# Patient Record
Sex: Male | Born: 1980 | Race: White | Hispanic: No | Marital: Married | Smoking: Former smoker
Health system: Southern US, Community
[De-identification: ages and names within clinical notes are randomized; demographics above are authoritative.]

## PROBLEM LIST (undated history)

## (undated) DIAGNOSIS — B029 Zoster without complications: Secondary | ICD-10-CM

---
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
## 2006-03-03 HISTORY — PX: OTHER SURGICAL HISTORY: SHX169

## 2011-06-26 ENCOUNTER — Ambulatory Visit (INDEPENDENT_AMBULATORY_CARE_PROVIDER_SITE_OTHER): Payer: 59 | Admitting: Family Medicine

## 2011-06-26 ENCOUNTER — Encounter: Payer: Self-pay | Admitting: Family Medicine

## 2011-06-26 VITALS — BP 130/90 | HR 68 | Ht 70.0 in | Wt 211.0 lb

## 2011-06-26 DIAGNOSIS — M545 Low back pain: Secondary | ICD-10-CM

## 2011-06-26 DIAGNOSIS — Z9189 Other specified personal risk factors, not elsewhere classified: Secondary | ICD-10-CM

## 2011-06-26 DIAGNOSIS — M542 Cervicalgia: Secondary | ICD-10-CM

## 2011-06-26 DIAGNOSIS — M62838 Other muscle spasm: Secondary | ICD-10-CM

## 2011-06-26 DIAGNOSIS — Z202 Contact with and (suspected) exposure to infections with a predominantly sexual mode of transmission: Secondary | ICD-10-CM

## 2011-06-26 MED ORDER — CYCLOBENZAPRINE HCL 10 MG PO TABS: 5.0000 mg | ORAL_TABLET | Freq: Three times a day (TID) | ORAL | Status: AC | PRN

## 2011-06-26 MED ORDER — NAPROXEN 500 MG PO TABS: 500.0000 mg | ORAL_TABLET | Freq: Two times a day (BID) | ORAL | Status: AC

## 2011-06-26 NOTE — Patient Instructions (Signed)
Do not use any pain medications while on the naprosyn, other than Tylenol Heat, stretches at least twice daily (as shown for your neck--best done when warm).  Back Exercises Back exercises help treat and prevent back injuries. The goal of back exercises is to increase the strength of your abdominal and back muscles and the flexibility of your back. These exercises should be started when you no longer have back pain. Back exercises include:  Pelvic Tilt. Lie on your back with your knees bent. Tilt your pelvis until the lower part of your back is against the floor. Hold this position 5 to 10 sec and repeat 5 to 10 times.   Knee to Chest. Pull first 1 knee up against your chest and hold for 20 to 30 seconds, repeat this with the other knee, and then both knees. This may be done with the other leg straight or bent, whichever feels better.   Sit-Ups or Curl-Ups. Bend your knees 90 degrees. Start with tilting your pelvis, and do a partial, slow sit-up, lifting your trunk only 30 to 45 degrees off the floor. Take at least 2 to 3 seconds for each sit-up. Do not do sit-ups with your knees out straight. If partial sit-ups are difficult, simply do the above but with only tightening your abdominal muscles and holding it as directed.   Hip-Lift. Lie on your back with your knees flexed 90 degrees. Push down with your feet and shoulders as you raise your hips a couple inches off the floor; hold for 10 seconds, repeat 5 to 10 times.   Back arches. Lie on your stomach, propping yourself up on bent elbows. Slowly press on your hands, causing an arch in your low back. Repeat 3 to 5 times. Any initial stiffness and discomfort should lessen with repetition over time.   Shoulder-Lifts. Lie face down with arms beside your body. Keep hips and torso pressed to floor as you slowly lift your head and shoulders off the floor.  Do not overdo your exercises, especially in the beginning. Exercises may cause you some mild back  discomfort which lasts for a few minutes; however, if the pain is more severe, or lasts for more than 15 minutes, do not continue exercises until you see your caregiver. Improvement with exercise therapy for back problems is slow.  See your caregivers for assistance with developing a proper back exercise program. Document Released: 03/27/2004 Document Revised: 02/06/2011 Document Reviewed: 02/17/2005 [facility] Patient Information 2012 [place], [place].  Burners or Stingers, Brachial Plexus Stretch Injury Burners or stingers are injuries which commonly happen in football or any other injury that stretches or injures the nerves from the neck to your arm. The nerves which come out of your neck and supply the arm and hand on both sides are called the brachial plexus. These nerves come from the spinal cord and wind down through the neck and under the collarbone and end as the nerves in your arms. They carry messages to and from the arm and hand. They allow you to move your arm and to also have feeling in your arms and hands.  SYMPTOMS   The main problem is a burning pain that starts in the neck and shoots into the arm.   There may be numbness, weakness or brief paralysis of the arm.   There may be needles and pins feelings which run from the neck and down the arm.   The type of problem depends on which nerves are damaged.   Shoulder  weakness and muscle tenderness of the neck may occur from hours up to days after the injury or sometimes not at all.  DIAGNOSIS   Your caregiver can usually diagnose this based on the history of injury, symptoms, and a physical examination.   Electromyography and nerve conduction tests may be helpful. These are tests to tell how well your nerves are working. (Your nerves are like the wires carrying electricity in your house).   Specialized x-rays may be done to make sure there is not an injury to the cervical spine (neck) or shoulder.  HOME CARE INSTRUCTIONS   Apply  ice to the shoulder and axilla (armpit) for 15 to 20 minutes, 3 to 4 times per day while awake for the first 2 days. Put the ice in a plastic bag and place a towel between the bag of ice and your skin.   Only take over-the-counter or prescription medicines for pain, discomfort, or fever as directed by your caregiver.   If you were given a splint or sling, wear them as instructed. You may remove them to shower.   An elastic bandage wrap may be useful for shoulder or upper-arm swelling.  RETURNING TO ACTIVITIES  You will not be able to return to activities in a contact sport until full strength and range of motion of the upper extremity and neck returns to normal, and EMG studies are negative. This means the injured nerves do not show continual problems. This can take more than 6 months. There should be no residual pain.   You can maintain your cardiovascular fitness by continuing to work out your lower extremities.  PREVENTION   Prevention measures include strengthening exercises of the neck and shoulder muscles and protective devices for your neck before starting sports again if that was the cause.   More than half of these injuries will happen again. It is very important to do the strengthening exercises recommended.   Wear neck and shoulder pads that are well fitted to prevent re-injury.  Sometimes it is impossible to tell the difference between an injury to the nerves that supply the arm and the spinal cord. If there is injury to the spinal cord your symptoms may worsen. It is important to pay attention to your condition. If there is any worsening of your condition, or if your condition does not improve, contact your caregiver or go to an emergency department. SEEK IMMEDIATE MEDICAL CARE IF:   You develop severe neck pain.   You lose control of your urine or stool.   You develop weakness in your arms or legs.  MAKE SURE YOU:   Understand these instructions.   Will watch your  condition.   Will get help right away if you are not doing well or get worse.  Document Released: 05/10/2003 Document Revised: 02/06/2011 Document Reviewed: 10/06/2007 [facility] Patient Information 2012 [place], [place].

## 2011-06-26 NOTE — Progress Notes (Signed)
Chief complaint: right shoulder pain x 2 weeks and mid back pain x year and a half. Also would lke STD check  HPI:  1.5 years ago started having pain in mid back (upper lumbar/lower thoracic area, bilaterally).  Wakes up most mornings with "tense back".  No improvement after changing mattress.  Stiffness in the morning, somewhat relieved by hot shower in the morning, but has some discomfort/tightness throughout the day. Denies radiation of pain, numbness, tingling or weakness.  Tried massage once, didn't help much.  H/o SI problems in the past, s/p PT, resolved.  R shoulder pain x 2 week.  Hurts with certain head positions, once even hurt just with swallowing.  Wakes up at night due to shoulder pain.  Tried stretches, some help with hot shower. Denies radiation of pain, no numbness or tingling or weakness. Pain is near shoulder blade, and up to shoulder.  Has used Advil 800mg  once daily, but not daily, only if severe pain.  Engaged to be married.  No sex in almost 2 years, waiting until marriage to his fiance, scheduled for about a year away.  5 partners in the past.  Unprotected sex with last partner, and she is known to have had unprotected sex with other partners also.  Denies symptoms.  Would like STD testing for peace of mind, given possible risks from last sexual contact.  History reviewed. No pertinent past medical history.  Past Surgical History  Procedure Date  . 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 Marital Status: Single    Spouse Name: N/A    Number of Children: N/A  . Years of Education: N/A   Occupational History  . Youth Optician, dispensing at [employer]    Social History Main Topics  . Smoking status: Never Smoker   . Smokeless tobacco: Never Used  . Alcohol Use: Yes     1-2 drinks some weeks.  . Drug Use: No  . Sexually Active: Not on file   Other Topics Concern  . Not on file   Social History Narrative   Engaged, 1  dog    Family History  Problem Relation Age of Onset  . Fibromyalgia Mother     possibly  . Hypertension Father   . Hyperlipidemia Father   . Hypertension Brother   . Hyperlipidemia Brother   . Cancer Paternal Grandfather     lung cancer (smoker and asbestos exposure)  . Diabetes Neg Hx    No current outpatient prescriptions on file prior to visit.   No Known Allergies  ROS:  Denies fevers, URI symptoms, chest pain, shortness of breath, abdominal pain, stool changes, numbness, tingling, weakness, urinary complaints, penile discharge, lesions, other skin rashes or lesions.  PHYSICAL EXAM: BP 130/90  Pulse 68  Ht 5\' 10"  (1.778 m)  Wt 211 lb (95.709 kg)  BMI 30.28 kg/m2 Well developed, pleasant, mildly anxious-appearing male in no distress Spine--nontender.  Area of pain is paraspinous muscles at lower thoracic/upper lumbar spine.  nontender and no spasm on exam today.  No CVA tenderness Neck: limited ROM with forward flexion due to mild spasm. No knots/trigger points palpable.  Area of discomfort is paraspinous muscles, and lower trapezius/upper rhomboids. Neck: no lymphadenopathy, thyromegaly or mass Heart: regular rate and rhythm without murmur Lungs: clear bilaterally Abdomen: soft, nontender, no organomegaly or mass Extremities: no edema Skin: no rash/lesions Psych: slightly anxious, full range of affect  ASSESSMENT/PLAN:  1. Possible exposure to STD  HIV Antibody, RPR, Hepatitis B surface antigen, GC/chlamydia probe amp, urine  2. Lumbago  naproxen (NAPROSYN) 500 MG tablet  3. Neck pain on right side  naproxen (NAPROSYN) 500 MG tablet  4. Muscle spasm  cyclobenzaprine (FLEXERIL) 10 MG tablet   Neck and back pain, muscular in nature with no evidence of radiculopathy.  Trial of heat, stretches, and NSAIDs.  Shown neck stretches.  If no improvement with these measures, consider PT or chiro  STD check--low risk. HIV, RPR, HepBSag, GC and chlamydia Call Monday to cell  phone with results--okay to LM  Recommended CPE in near future

## 2011-08-28 ENCOUNTER — Telehealth: Payer: Self-pay | Admitting: *Deleted

## 2011-08-28 NOTE — Telephone Encounter (Signed)
Spoke with patient and he has been taking Aleve for just a few days w/o any relief. I offered to call in naprosyn-he stated that he okay taking the Aleve. He will give it 5-7 more days and give me a call back for appt if needed.

## 2011-08-28 NOTE — Telephone Encounter (Signed)
Patient called and stated that he still has back pain that he told you about in May. But also he has been experiencing some numbness since last visit in his left arm and left leg, not every day but today numbness has been all day today. Numbness is a dull feeling, not tingly-arm feels kind of heavy. He wanted me to send you a message back. Would you like him to schedule appt? Please advise. Thanks.

## 2011-08-28 NOTE — Telephone Encounter (Signed)
If he hasn't been taking the naprosyn recently, can refill.  Appears to be having some radicular symptoms now, that he didn't at last appointment.  If no improvement after being on NSAIDS for 7-10 days, then needs appt.  If he has already been taking them, then schedule visit.  As we discussed at visit, may benefit from PT or chiro.  Doesn't need referral for chiro, can do on his own (Dr. Thereasa Distance is recommended).  PT requires eval and referral.  If any weakness is found on exam, may need imaging, may need other med changes, which is why OV is recommended.

## 2011-10-27 ENCOUNTER — Encounter: Payer: Self-pay | Admitting: Family Medicine

## 2011-10-27 ENCOUNTER — Ambulatory Visit (INDEPENDENT_AMBULATORY_CARE_PROVIDER_SITE_OTHER): Payer: 59 | Admitting: Family Medicine

## 2011-10-27 VITALS — BP 130/76 | HR 80 | Ht 70.0 in | Wt 208.0 lb

## 2011-10-27 DIAGNOSIS — E7800 Pure hypercholesterolemia, unspecified: Secondary | ICD-10-CM

## 2011-10-27 DIAGNOSIS — M545 Low back pain, unspecified: Secondary | ICD-10-CM

## 2011-10-27 DIAGNOSIS — Z0000 Encounter for general adult medical examination without abnormal findings: Secondary | ICD-10-CM

## 2011-10-27 DIAGNOSIS — M546 Pain in thoracic spine: Secondary | ICD-10-CM

## 2011-10-27 LAB — POCT URINALYSIS DIPSTICK
Bilirubin, UA: NEGATIVE
Glucose, UA: NEGATIVE
Ketones, UA: NEGATIVE
Leukocytes, UA: NEGATIVE

## 2011-10-27 NOTE — Progress Notes (Signed)
Chief Complaint  Patient presents with  . Annual Exam    non fasting CPE. UA showed trace protein. Still having back pain that was mentioned @ April 2013. Unsure if his insurance covers chiropractor, but does probably cover ortho.   Jason Casey is a 31 y.o. male who presents for a complete physical.  He has the following concerns:  Some ongoing pain in upper back and some lower back.  Naprosyn helped most of the time.  Ran out, and has been using ibuprofen just prn.  Heat and stretches help some.  Has pain in his upper back, near shoulder blades during the night, better after stretching. Sometimes affects his sleep.  Health Maintenance: There is no immunization history on file for this patient. Thinks he may have had tetanus in 4/08 after slicing off part of his finger.  Not sure though. Doesn't take flu shots Last colonoscopy: never Ophtho: >5 years Dentist: >1 year ago Exercise: 5-6 days/week over the last 1-2 months, both cardio and weights Recalls having cholesterol "a little high" in the past.  History reviewed. No pertinent past medical history.  Past Surgical History  Procedure Date  . 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 Marital Status: Single    Spouse Name: N/A    Number of Children: N/A  . Years of Education: N/A   Occupational History  . Youth Optician, dispensing at [employer]    Social History Main Topics  . Smoking status: Never Smoker   . Smokeless tobacco: Never Used  . Alcohol Use: Yes     1-2 drinks some weeks.  . Drug Use: No  . Sexually Active: Not Currently   Other Topics Concern  . Not on file   Social History Narrative   Engaged, 1 dog.  Getting married July 2014    Family History  Problem Relation Age of Onset  . Fibromyalgia Mother     possibly  . Diabetes Mother     recently diagnosed, checking sugars, no meds  . Hypertension Father   . Hyperlipidemia Father   . Hypertension Brother   .  Hyperlipidemia Brother   . Cancer Paternal Grandfather     lung cancer (smoker and asbestos exposure)   Meds: ibuprofen as needed (OTC)  No Known Allergies  ROS:  The patient denies anorexia, fever, weight changes,  vision loss, decreased hearing, ear pain, hoarseness, chest pain, palpitations, dizziness, syncope, dyspnea on exertion, cough, swelling, nausea, vomiting, diarrhea, constipation, abdominal pain, melena, hematochezia, indigestion/heartburn, hematuria, incontinence, erectile dysfunction, nocturia, weakened urine stream, dysuria, genital lesions, joint pains, numbness, tingling, weakness, tremor, suspicious skin lesions, depression, anxiety, abnormal bleeding/bruising, or enlarged lymph nodes Some shortness of breath when having pain between shoulder blades (short term, better with stretching).  Pain not related to eating, usually in mornings only. Some lightheadedness when he was using Aleve (after finishing naproxen).  No problems since using ibuprofen instead.  Occasional headaches, especially if not well hydrated. Some dry spots within mustache, and occasional itchy dry spot on L cheek.  Skin tag on scrotum  PHYSICAL EXAM: BP 140/80  Pulse 80  Ht 5\' 10"  (1.778 m)  Wt 208 lb (94.348 kg)  BMI 29.84 kg/m2 130/76 General Appearance:    Alert, cooperative, no distress, appears stated age  Head:    Normocephalic, without obvious abnormality, atraumatic  Eyes:    PERRL, conjunctiva/corneas clear, EOM's intact, fundi    benign  Ears:    Normal TM's and external ear canals  Nose:   Nares normal, mucosa normal, no drainage or sinus   tenderness  Throat:   Lips, mucosa, and tongue normal; teeth and gums normal  Neck:   Supple, no lymphadenopathy;  thyroid:  no   enlargement/tenderness/nodules; no carotid   bruit or JVD  Back:    Spine nontender, no curvature, ROM normal, no CVA     Tenderness.  Area of pain is in upper rhomboids.  No spasm noted today, mildly tender  Lungs:     Clear  to auscultation bilaterally without wheezes, rales or     ronchi; respirations unlabored  Chest Wall:    No tenderness or deformity   Heart:    Regular rate and rhythm, S1 and S2 normal, no murmur, rub   or gallop  Breast Exam:    No chest wall tenderness, masses or gynecomastia  Abdomen:     Soft, non-tender, nondistended, normoactive bowel sounds,    no masses, no hepatosplenomegaly  Genitalia:    Normal male external genitalia without lesions.  Testicles without masses.  No inguinal hernias.  Rectal:   Deferred due to age <40 and lack of symptoms  Extremities:   No clubbing, cyanosis or edema  Pulses:   2+ and symmetric all extremities  Skin:   Skin color, texture, turgor normal, no lesions. My erythema and flaking noted under R side of mustache hair.   Lymph nodes:   Cervical, supraclavicular, and axillary nodes normal  Neurologic:   CNII-XII intact, normal strength, sensation and gait; reflexes 2+ and symmetric throughout          Psych:   Normal mood, affect, hygiene and grooming.    ASSESSMENT/PLAN: 1. Routine general medical examination at a health care facility  POCT Urinalysis Dipstick, Visual acuity screening  2. Lumbago  Ambulatory referral to Physical Therapy  3. Thoracic back pain  Ambulatory referral to Physical Therapy    Labs recommended:  Lipids (high in past), glucose.  He prefers to do in 3 months, allowing for further work on diet for now.  Check with [facility] re: tetanus. If he did not receive Tdap, then schedule NV  Refer to PT for back (upper and lower) pain  Probable seborrheic dermatitis by history--discussed OTC treatments (dandruff shampoo vs OTC cortisone creams)

## 2011-10-27 NOTE — Patient Instructions (Addendum)
HEALTH MAINTENANCE RECOMMENDATIONS:  It is recommended that you get at least 30 minutes of aerobic exercise at least 5 days/week (for weight loss, you may need as much as 60-90 minutes). This can be any activity that gets your heart rate up. This can be divided in 10-15 minute intervals if needed, but try and build up your endurance at least once a week.  Weight bearing exercise is also recommended twice weekly.  Eat a healthy diet with lots of vegetables, fruits and fiber.  "Colorful" foods have a lot of vitamins (ie green vegetables, tomatoes, red peppers, etc).  Limit sweet tea, regular sodas and alcoholic beverages, all of which has a lot of calories and sugar.  Up to 2 alcoholic drinks daily may be beneficial for men (unless trying to lose weight, watch sugars).  Drink a lot of water.  Sunscreen of at least SPF 30 should be used on all sun-exposed parts of the skin when outside between the hours of 10 am and 4 pm (not just when at beach or pool, but even with exercise, golf, tennis, and yard work!)  Use a sunscreen that says "broad spectrum" so it covers both UVA and UVB rays, and make sure to reapply every 1-2 hours.  Remember to change the batteries in your smoke detectors when changing your clock times in the spring and fall.  Use your seat belt every time you are in a car, and please drive safely and not be distracted with cell phones and texting while driving.  Check on your last tetanus shot--I would like to know the date and the TYPE (ie plain tetanus vs TdaP).  A TdaP is recommended (contains pertussis vaccine also).  Check your blood pressure periodically.  Goal is <130-135/80-85.  If consistently >140/90 then medications should be considered.  Daily exercise, weight loss and low sodium diet helps lower blood pressure.  Schedule visits with eye doctor (routine check) and dental cleanings.  2 Gram Low Sodium Diet A 2 gram sodium diet restricts the amount of sodium in the diet to no  more than 2 g or 2000 mg daily. Limiting the amount of sodium is often used to help lower blood pressure. It is important if you have heart, liver, or kidney problems. Many foods contain sodium for flavor and sometimes as a preservative. When the amount of sodium in a diet needs to be low, it is important to know what to look for when choosing foods and drinks. The following includes some information and guidelines to help make it easier for you to adapt to a low sodium diet. QUICK TIPS  Do not add salt to food.   Avoid convenience items and fast food.   Choose unsalted snack foods.   Buy lower sodium products, often labeled as "lower sodium" or "no salt added."   Check food labels to learn how much sodium is in 1 serving.   When eating at a restaurant, ask that your food be prepared with less salt or none, if possible.  READING FOOD LABELS FOR SODIUM INFORMATION The nutrition facts label is a good place to find how much sodium is in foods. Look for products with no more than 500 to 600 mg of sodium per meal and no more than 150 mg per serving. Remember that 2 g = 2000 mg. The food label may also list foods as:  Sodium-free: Less than 5 mg in a serving.   Very low sodium: 35 mg or less in a serving.  Low-sodium: 140 mg or less in a serving.   Light in sodium: 50% less sodium in a serving. For example, if a food that usually has 300 mg of sodium is changed to become light in sodium, it will have 150 mg of sodium.   Reduced sodium: 25% less sodium in a serving. For example, if a food that usually has 400 mg of sodium is changed to reduced sodium, it will have 300 mg of sodium.  CHOOSING FOODS Grains  Avoid: Salted crackers and snack items. Some cereals, including instant hot cereals. Bread stuffing and biscuit mixes. Seasoned rice or pasta mixes.   Choose: Unsalted snack items. Low-sodium cereals, oats, puffed wheat and rice, shredded wheat. English muffins and bread. Pasta.   Meats  Avoid: Salted, canned, smoked, spiced, pickled meats, including fish and poultry. Bacon, ham, sausage, cold cuts, hot dogs, anchovies.   Choose: Low-sodium canned tuna and salmon. Fresh or frozen meat, poultry, and fish.  Dairy  Avoid: Processed cheese and spreads. Cottage cheese. Buttermilk and condensed milk. Regular cheese.   Choose: Milk. Low-sodium cottage cheese. Yogurt. Sour cream. Low-sodium cheese.  Fruits and Vegetables  Avoid: Regular canned vegetables. Regular canned tomato sauce and paste. Frozen vegetables in sauces. Olives. Rosita Fire. Relishes. Sauerkraut.   Choose: Low-sodium canned vegetables. Low-sodium tomato sauce and paste. Frozen or fresh vegetables. Fresh and frozen fruit.  Condiments  Avoid: Canned and packaged gravies. Worcestershire sauce. Tartar sauce. Barbecue sauce. Soy sauce. Steak sauce. Ketchup. Onion, garlic, and table salt. Meat flavorings and tenderizers.   Choose: Fresh and dried herbs and spices. Low-sodium varieties of mustard and ketchup. Lemon juice. Tabasco sauce. Horseradish.  SAMPLE 2 GRAM SODIUM MEAL PLAN Breakfast / Sodium (mg)  1 cup low-fat milk / 143 mg   2 slices whole-wheat toast / 270 mg   1 tbs heart-healthy margarine / 153 mg   1 hard-boiled egg / 139 mg   1 small orange / 0 mg  Lunch / Sodium (mg)  1 cup raw carrots / 76 mg    cup hummus / 298 mg   1 cup low-fat milk / 143 mg    cup red grapes / 2 mg   1 whole-wheat pita bread / 356 mg  Dinner / Sodium (mg)  1 cup whole-wheat pasta / 2 mg   1 cup low-sodium tomato sauce / 73 mg   3 oz lean ground beef / 57 mg   1 small side salad (1 cup raw spinach leaves,  cup cucumber,  cup yellow bell pepper) with 1 tsp olive oil and 1 tsp red wine vinegar / 25 mg  Snack / Sodium (mg)  1 container low-fat vanilla yogurt / 107 mg   3 graham cracker squares / 127 mg  Nutrient Analysis  Calories: 2033   Protein: 77 g   Carbohydrate: 282 g   Fat: 72 g    Sodium: 1971 mg  Document Released: 02/17/2005 Document Revised: 02/06/2011 Document Reviewed: 05/21/2009 [facility] Patient Information 2012 [place], [place].  Seborrheic Dermatitis Seborrheic dermatitis involves pink or red skin with greasy, flaky scales. This is often found on the scalp, eyebrows, nose, bearded area, and on or behind the ears. It can also occur on the central chest. It often occurs where there are more oil (sebaceous) glands. This condition is also known as dandruff. When this condition affects a baby's scalp, it is called cradle cap. It may come and go for no known reason. It can occur at any time of life  from infancy to old age. CAUSES  The cause is unknown. It is not the result of too little moisture or too much oil. In some people, seborrheic dermatitis flare-ups seem to be triggered by stress. It also commonly occurs in people with certain diseases such as Parkinson's disease or HIV/AIDS. SYMPTOMS   Thick scales on the scalp.   Redness on the face or in the armpits.   The skin may seem oily or dry, but moisturizers do not help.   In infants, seborrheic dermatitis appears as scaly redness that does not seem to bother the baby. In some babies, it affects only the scalp. In others, it also affects the neck creases, armpits, groin, or behind the ears.   In adults and adolescents, seborrheic dermatitis may affect only the scalp. It may look patchy or spread out, with areas of redness and flaking. Other areas commonly affected include:   Eyebrows.   Eyelids.   Forehead.   Skin behind the ears.   Outer ears.   Chest.   Armpits.   Nose creases.   Skin creases under the breasts.   Skin between the buttocks.   Groin.   Some adults and adolescents feel itching or burning in the affected areas.  DIAGNOSIS  Your caregiver can usually tell what the problem is by doing a physical exam. TREATMENT   Cortisone (steroid) ointments, creams, and lotions can help  decrease inflammation.   Babies can be treated with baby oil to soften the scales, then they may be washed with baby shampoo. If this does not help, medicated shampoos should work.   Adults can also use medicated shampoos.   Your caregiver may prescribe corticosteroid cream and shampoo containing an antifungal or yeast medicine (ketoconazole). Hydrocortisone or anti-yeast cream can be rubbed directly onto seborrheic dermatitis patches. Yeast does not cause seborrheic dermatitis, but it seems to add to the problem.  In infants, seborrheic dermatitis is often worst during the first year of life. It tends to disappear on its own as the child grows. However, it may return during the teenage years. In adults and adolescents, seborrheic dermatitis tends to be a long-lasting condition that comes and goes over many years. HOME CARE INSTRUCTIONS   Use prescribed medicines as directed.   In infants, do not aggressively remove the scales or flakes on the scalp with a comb or by other means. This may lead to hair loss.  SEEK MEDICAL CARE IF:   The problem does not improve from the medicated shampoos, lotions, or other medicines given by your caregiver.   You have any other questions or concerns.  Document Released: 02/17/2005 Document Revised: 02/06/2011 Document Reviewed: 07/09/2009 [facility] Patient Information 2012 [place], [place].

## 2011-11-17 ENCOUNTER — Ambulatory Visit: Payer: 59 | Attending: Family Medicine | Admitting: Physical Therapy

## 2011-11-17 DIAGNOSIS — M546 Pain in thoracic spine: Secondary | ICD-10-CM | POA: Insufficient documentation

## 2011-11-17 DIAGNOSIS — R293 Abnormal posture: Secondary | ICD-10-CM | POA: Insufficient documentation

## 2011-11-17 DIAGNOSIS — M545 Low back pain, unspecified: Secondary | ICD-10-CM | POA: Insufficient documentation

## 2011-11-17 DIAGNOSIS — IMO0001 Reserved for inherently not codable concepts without codable children: Secondary | ICD-10-CM | POA: Insufficient documentation

## 2011-11-26 ENCOUNTER — Ambulatory Visit: Payer: 59 | Admitting: Rehabilitation

## 2011-11-28 ENCOUNTER — Ambulatory Visit: Payer: 59 | Admitting: Physical Therapy

## 2011-12-01 ENCOUNTER — Ambulatory Visit: Payer: 59 | Admitting: Rehabilitation

## 2011-12-03 ENCOUNTER — Ambulatory Visit: Payer: 59 | Attending: Family Medicine | Admitting: Rehabilitation

## 2011-12-03 DIAGNOSIS — M545 Low back pain, unspecified: Secondary | ICD-10-CM | POA: Insufficient documentation

## 2011-12-03 DIAGNOSIS — IMO0001 Reserved for inherently not codable concepts without codable children: Secondary | ICD-10-CM | POA: Insufficient documentation

## 2011-12-03 DIAGNOSIS — M546 Pain in thoracic spine: Secondary | ICD-10-CM | POA: Insufficient documentation

## 2011-12-03 DIAGNOSIS — R293 Abnormal posture: Secondary | ICD-10-CM | POA: Insufficient documentation

## 2011-12-08 ENCOUNTER — Ambulatory Visit: Payer: 59 | Admitting: Rehabilitation

## 2011-12-10 ENCOUNTER — Ambulatory Visit: Payer: 59 | Admitting: Rehabilitation

## 2011-12-15 ENCOUNTER — Ambulatory Visit: Payer: 59 | Admitting: Rehabilitation

## 2011-12-17 ENCOUNTER — Encounter: Payer: 59 | Admitting: Rehabilitation

## 2012-10-05 ENCOUNTER — Other Ambulatory Visit: Payer: Self-pay

## 2012-11-17 ENCOUNTER — Telehealth: Payer: Self-pay | Admitting: *Deleted

## 2012-11-17 NOTE — Telephone Encounter (Signed)
Left message for pt to return my call when he could.

## 2012-11-17 NOTE — Telephone Encounter (Signed)
Message copied by Melonie Florida on Wed Nov 17, 2012  1:18 PM ------      Message from: KNAPP, EVE      Created: Mon Nov 15, 2012  9:02 AM       Send letter or call--seen over a year ago, never came for fasting labs.  I know he took a new job, not sure if he is still in the area ------

## 2013-02-22 ENCOUNTER — Encounter: Payer: Self-pay | Admitting: Family Medicine

## 2013-02-22 ENCOUNTER — Ambulatory Visit (INDEPENDENT_AMBULATORY_CARE_PROVIDER_SITE_OTHER): Payer: BC Managed Care – PPO | Admitting: Family Medicine

## 2013-02-22 VITALS — BP 124/80 | HR 97 | Temp 99.7°F | Wt 198.0 lb

## 2013-02-22 DIAGNOSIS — J069 Acute upper respiratory infection, unspecified: Secondary | ICD-10-CM

## 2013-02-22 NOTE — Patient Instructions (Addendum)
You can take 4 ibuprofen 3 times per day. NyQuil at night. This usually runs its course in about 7-10 days. Use Afrin nasal spray but only at nightUpper Respiratory Infection, Adult An upper respiratory infection (URI) is also known as the common cold. It is often caused by a type of germ (virus). Colds are easily spread (contagious). You can pass it to others by kissing, coughing, sneezing, or drinking out of the same glass. Usually, you get better in 1 or 2 weeks.  HOME CARE   Only take medicine as told by your doctor.  Use a warm mist humidifier or breathe in steam from a hot shower.  Drink enough water and fluids to keep your pee (urine) clear or pale yellow.  Get plenty of rest.  Return to work when your temperature is back to normal or as told by your doctor. You may use a face mask and wash your hands to stop your cold from spreading. GET HELP RIGHT AWAY IF:   After the first few days, you feel you are getting worse.  You have questions about your medicine.  You have chills, shortness of breath, or brown or red spit (mucus).  You have yellow or brown snot (nasal discharge) or pain in the face, especially when you bend forward.  You have a fever, puffy (swollen) neck, pain when you swallow, or white spots in the back of your throat.  You have a bad headache, ear pain, sinus pain, or chest pain.  You have a high-pitched whistling sound when you breathe in and out (wheezing).  You have a lasting cough or cough up blood.  You have sore muscles or a stiff neck. MAKE SURE YOU:   Understand these instructions.  Will watch your condition.  Will get help right away if you are not doing well or get worse. Document Released: 08/06/2007 Document Revised: 05/12/2011 Document Reviewed: 06/24/2010 [facility] Patient Information 2014 [place], [place].

## 2013-02-22 NOTE — Progress Notes (Signed)
   Subjective:    Patient ID: Jason Casey, male    DOB: October 09, 1980, 32 y.o.   MRN: [id number]  HPI 3 days ago he started having difficulty with a slight cough followed by congestion, fever or headache malaise and myalgias and fatigue. The cough has become slightly productive. He does have a trip planned to [place] after Christmas. He does not smoke.   Review of Systems     Objective:   Physical Exam alert and in no distress. Tympanic membranes and canals are normal. Throat is clear. Tonsils are normal. Neck is supple without adenopathy or thyromegaly. Cardiac exam shows a regular sinus rhythm without murmurs or gallops. Lungs are clear to auscultation.        Assessment & Plan:  Acute URI  recommend supportive care with NyQuil, plenty of fluids and NSAID of choice for aches and pains. Call if continued difficulty.

## 2013-08-17 ENCOUNTER — Ambulatory Visit (INDEPENDENT_AMBULATORY_CARE_PROVIDER_SITE_OTHER): Payer: BC Managed Care – PPO | Admitting: Family Medicine

## 2013-08-17 ENCOUNTER — Encounter: Payer: Self-pay | Admitting: Family Medicine

## 2013-08-17 VITALS — BP 138/68 | HR 80 | Temp 98.1°F | Ht 70.0 in | Wt 206.0 lb

## 2013-08-17 DIAGNOSIS — IMO0001 Reserved for inherently not codable concepts without codable children: Secondary | ICD-10-CM

## 2013-08-17 DIAGNOSIS — W57XXXA Bitten or stung by nonvenomous insect and other nonvenomous arthropods, initial encounter: Principal | ICD-10-CM

## 2013-08-17 DIAGNOSIS — S40861A Insect bite (nonvenomous) of right upper arm, initial encounter: Secondary | ICD-10-CM

## 2013-08-17 MED ORDER — CEPHALEXIN 500 MG PO CAPS: 500.0000 mg | ORAL_CAPSULE | Freq: Three times a day (TID) | ORAL | Status: DC

## 2013-08-17 NOTE — Progress Notes (Signed)
Chief Complaint  Patient presents with  . Insect Bite    Saturday night area on his right bicep was itching and he found a little place that he popped. Now area is larger and tender, slight itching. Did not remove any kind of insect. Right arm was tingling this morning.    While in [place] this past weekend, felt it itching on his right upper arm, there was a small pustule that he popped, small amount of drainage.  He didn't notice any redness until last night.  It is slightly itchy.  Denies fevers, myalgias, headaches, bleeding/bruising, rashes elsewhere on the body. There was no tick that he ever saw attached--was not aware of any known insect bite (didn't see spider, bee, etc).  He is going to [place] tomorrow.  History reviewed. No pertinent past medical history. Past Surgical History  Procedure Laterality Date  . 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 Marital Status: Single    Spouse Name: N/A    Number of Children: N/A  . Years of Education: N/A   Occupational History  . Youth Optician, dispensingMinister at [employer]    Social History Main Topics  . Smoking status: Never Smoker   . Smokeless tobacco: Never Used  . Alcohol Use: Yes     Comment: 1-2 drinks some weeks.  . Drug Use: No  . Sexual Activity: Not Currently   Other Topics Concern  . Not on file   Social History Narrative   Married, 1 dog, 1 cat.  Social studies teacher at [employer]   Family History  Problem Relation Age of Onset  . Fibromyalgia Mother     possibly  . Diabetes Mother     recently diagnosed, checking sugars, no meds  . Hypertension Father   . Hyperlipidemia Father   . Hypertension Brother   . Hyperlipidemia Brother   . Cancer Paternal Grandfather     lung cancer (smoker and asbestos exposure)   No current outpatient prescriptions on file prior to visit.   No current facility-administered medications on file prior to visit.   No Known  Allergies  ROS:  No fevers, chills, nausea, vomiting, headache, dizziness, chest pain, URI symptoms, bleeding/bruising, myalgias or other complaints--see HPI  PHYSICAL EXAM: BP 138/68  Pulse 80  Temp(Src) 98.1 F (36.7 C) (Oral)  Ht 5\' 10"  (1.778 m)  Wt 206 lb (93.441 kg)  BMI 29.56 kg/m2  Well developed, pleasant male in no distress Right anterior upper bicep area --there is a circular raised area measuring 47x45 mm, with the 15-17 mm of the most central area being a darker red, and the rest being very light.  The center has a very tiny raised scab.  No pustule. No fluctuance, no induration.  No streaks  ASSESSMENT/PLAN:  Insect bite of arm, right  Insect bite with local reaction vs early cellulitis. Treat with ice and antihistamines (ie zyrtec OR claritin, benadryl at bedtime if needed for itching) The area of swelling was marked with pen.  If the redness and swelling extend beyond the markings in the next 24-48 hours despite the ice and antihistamines (especially if fever develops), then start the antibiotics, and take the full course.  Please return and/or call if you develop a target lesion (rash looking like a bulls-eye), rash, fevers, body aches/joint pains, severe headache, or other symptoms.

## 2013-08-17 NOTE — Patient Instructions (Signed)
  Insect bite with local reaction vs early cellulitis. Treat with ice and antihistamines (ie zyrtec OR claritin, benadryl at bedtime if needed for itching) The area of swelling was marked with pen.  If the redness and swelling extend beyond the markings in the next 24-48 hours despite the ice and antihistamines (especially if fever develops), then start the antibiotics, and take the full course.  Please return and/or call if you develop a target lesion (rash looking like a bulls-eye), rash, fevers, body aches/joint pains, severe headache, or other symptoms.

## 2016-04-04 ENCOUNTER — Ambulatory Visit (INDEPENDENT_AMBULATORY_CARE_PROVIDER_SITE_OTHER): Payer: BC Managed Care – PPO | Admitting: Family Medicine

## 2016-04-04 ENCOUNTER — Encounter: Payer: Self-pay | Admitting: Family Medicine

## 2016-04-04 VITALS — BP 120/70 | HR 74 | Temp 98.4°F | Resp 16 | Wt 208.6 lb

## 2016-04-04 DIAGNOSIS — R05 Cough: Secondary | ICD-10-CM | POA: Diagnosis not present

## 2016-04-04 DIAGNOSIS — R059 Cough, unspecified: Secondary | ICD-10-CM

## 2016-04-04 DIAGNOSIS — J069 Acute upper respiratory infection, unspecified: Secondary | ICD-10-CM

## 2016-04-04 MED ORDER — AZITHROMYCIN 250 MG PO TABS: ORAL_TABLET | ORAL | 0 refills | Status: DC

## 2016-04-04 NOTE — Progress Notes (Signed)
Subjective: Chief Complaint  Patient presents with  . cough    coughing, phelgm- yellow, headache, tired     Jason NettersDavid Casey is a 36 y.o. male who presents for a week history of cough that is mainly dry and productive in the morning. States he feels like his cough is getting worse.   Denies fever, chills, shortness of breath, wheezing, GI or GU symptoms.  Denies history of pneumonia or bronchitis. No recent antibiotics.   Treatment to date: cough suppressants and decongestants.  Denies sick contacts.  No other aggravating or relieving factors.  No other c/o.  ROS as in subjective.   Objective: Vitals:   04/04/16 1517  BP: 120/70  Pulse: 74  Resp: 16  Temp: 98.4 F (36.9 C)    General appearance: Alert, WD/WN, no distress, mildly ill appearing                             Skin: warm, no rash                           Head: no sinus tenderness                            Eyes: conjunctiva normal, corneas clear, PERRLA                            Ears: pearly TMs, external ear canals normal                          Nose: septum midline, turbinates swollen, with erythema and clear discharge             Mouth/throat: MMM, tongue normal, mild pharyngeal erythema                           Neck: supple, no adenopathy, no thyromegaly, nontender                          Heart: RRR, normal S1, S2, no murmurs                         Lungs: CTA bilaterally, no wheezes, rales, or rhonchi      Assessment: Cough  Acute URI    Plan: Discussed diagnosis and treatment of URI and cough. Z-pack sent to pharmacy.  Suggested symptomatic OTC remedies. Nasal saline spray for congestion.  Tylenol or Ibuprofen OTC for fever and malaise.  Call/return if not back to baseline on day 10 of starting the antibiotic or sooner if he gets worse.

## 2017-09-21 ENCOUNTER — Ambulatory Visit: Payer: BC Managed Care – PPO | Admitting: Family Medicine

## 2017-09-21 ENCOUNTER — Encounter: Payer: Self-pay | Admitting: Family Medicine

## 2017-09-21 VITALS — BP 120/74 | HR 80 | Ht 69.0 in | Wt 202.0 lb

## 2017-09-21 DIAGNOSIS — Z23 Encounter for immunization: Secondary | ICD-10-CM

## 2017-09-21 DIAGNOSIS — Z111 Encounter for screening for respiratory tuberculosis: Secondary | ICD-10-CM

## 2017-09-21 DIAGNOSIS — Z0289 Encounter for other administrative examinations: Secondary | ICD-10-CM | POA: Diagnosis not present

## 2017-09-21 NOTE — Progress Notes (Signed)
Chief Complaint  Patient presents with  . Immunizations    need updated immunzations and ppd. Has forms that need to be filled out.    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 He brings in some immunizations, and also printed from [place].  He recalls his last tetanus was in 1993 related to an injury. He only has had 1 MMR documented. He does not have any known exposure to TB, no cough, hemoptysis, night sweats.  He is required to get PPD for school  Immunization History  Administered Date(s) Administered  . DTaP 11/12/1980, 02/14/1981, 04/30/1981, 06/20/1982  . IPV 11/12/1980, 02/14/1981, 04/30/1981, 06/20/1982  . MMR 01/04/1982, 09/21/2017  . PPD Test 09/21/2017  . Tdap 09/21/2017   PMH, PSH, SH reviewed  No current outpatient medications on file prior to visit.   No current facility-administered medications on file prior to visit.    No Known Allergies  ROS:  No fever, chills, headaches, dizziness, chest pain, shortness of breath, hearing or vision problems, back pain, GI complaints, rashes or other concerns.  PHYSICAL EXAM: BP 120/74   Pulse 80   Ht _0  (1.753 m)   Wt 202 lb (91.6 kg)   BMI 29.83 kg/m   Well appearing, pleasant male in no distress HEENT: conjunctiva and sclera are clear, EOMI, PERRL OP clear Neck: no lymphadenopathy, thyromegaly or mass Heart: regular rate and rhythm Lungs: clear bilaterally Back: no spinal or CVA tenderness Abdomen: soft, nontender, no mass Chest wall nontender, no mass Extremities: no edema Skin: normal turgor, no rash Psych: normal mood, affect, hygiene and grooming Neuro: alert and oriented, normal cranial nerves and gait  ASSESSMENT/PLAN:  Encounter for completion of form with patient  Need for Tdap vaccination - Plan: Tdap vaccine greater than or equal to 7yo IM  Need for MMR vaccine - Plan: MMR vaccine subcutaneous  Screening examination for pulmonary  tuberculosis - Plan: PPD   Tdap MMR #2 PPD placed To return in 2-3 days for PPD reading. Risks/side effects of vaccines reviewed   To consider Hep A vaccination (not required, no rush) Discussed other "recommended" but not required vaccines listed on [employer]'s forms--not at high risk for meningitis (not living in dorm, small classes), in monogamous relationship, not high risk for HPV. Not at risk for Hep B with his job. Continue yearly flu shots in the fall Pt to schedule a physical.  Forms filled out (pending PPD reading), no restrictions.

## 2017-12-03 ENCOUNTER — Encounter: Payer: Self-pay | Admitting: *Deleted

## 2018-01-31 DIAGNOSIS — B029 Zoster without complications: Secondary | ICD-10-CM

## 2018-01-31 HISTORY — DX: Zoster without complications: B02.9

## 2018-02-13 ENCOUNTER — Telehealth: Payer: Self-pay | Admitting: Nurse Practitioner

## 2018-02-13 DIAGNOSIS — B029 Zoster without complications: Secondary | ICD-10-CM

## 2018-02-13 MED ORDER — VALACYCLOVIR HCL 1 G PO TABS: 1000.0000 mg | ORAL_TABLET | Freq: Three times a day (TID) | ORAL | 0 refills | Status: DC

## 2018-02-13 MED ORDER — GABAPENTIN 300 MG PO CAPS: 300.0000 mg | ORAL_CAPSULE | Freq: Two times a day (BID) | ORAL | 0 refills | Status: DC | PRN

## 2018-02-13 NOTE — Addendum Note (Signed)
Addended by: Bennie PieriniMARTIN, MARY-MARGARET on: 02/13/2018 03:45 PM   Modules accepted: Orders

## 2018-02-13 NOTE — Progress Notes (Signed)
E-visit for Shingles   We are sorry that you are not feeling well. Here is how we plan to help!  Based on what you shared with me it looks like you have shingles.  Shingles or herpes zoster, is a common infection of the nerves.  It is a painful rash caused by the herpes zoster virus.  This is the same virus that causes chickenpox.  After a person has chickenpox, the virus remains inactive in the nerve cells.  Years later, the virus can become active again and travel to the skin.  It typically will appear on one side of the face or body.  Burning or shooting pain, tingling, or itching are early signs of the infection.  Blisters typically scab over in 7 to 10 days and clear up within 2-4 weeks. Shingles is only contagious to people that have never had the chickenpox, the chickenpox vaccine, or anyone who has a compromised immune system.  You should avoid contact with these type of people until your blisters scab over.  I have prescribed Valacyclovir 1g three times daily for 7 days and also Gabapentin 300mg twice daily as needed for pain   HOME CARE: . Apply ice packs (wrapped in a thin towel), cool compresses, or soak in cool bath to help reduce pain. . Use calamine lotion to calm itchy skin. . Avoid scratching the rash. . Avoid direct sunlight.  GET HELP RIGHT AWAY IF: . Symptoms that don't away after treatment. . A rash or blisters near your eye. . Increased drainage, fever, or rash after treatment. . Severe pain that doesn't go away.   MAKE SURE YOU    Understand these instructions.  Will watch your condition.  Will get help right away if you are not doing well or get worse.  Thank you for choosing an e-visit. Your e-visit answers were reviewed by a board certified advanced clinical practitioner to complete your personal care plan. Depending upon the condition, your plan could have included both over the counter or prescription medications.  Please review your pharmacy choice.  Make sure the pharmacy is open so you can pick up prescription now. If there is a problem, you may contact your provider through MyChart messaging and have the prescription routed to another pharmacy.  Your safety is important to us. If you have drug allergies check your prescription carefully.   For the next 24 hours you can use MyChart to ask questions about today's visit, request a non-urgent call back, or ask for a work or school excuse.  You will get an email in the next two days asking about your experience. I hope that your e-visit has been valuable and will speed your recovery  

## 2018-07-20 ENCOUNTER — Telehealth: Payer: Self-pay | Admitting: Physician Assistant

## 2018-07-20 ENCOUNTER — Encounter (HOSPITAL_COMMUNITY): Payer: Self-pay | Admitting: Family Medicine

## 2018-07-20 ENCOUNTER — Other Ambulatory Visit: Payer: Self-pay

## 2018-07-20 ENCOUNTER — Ambulatory Visit (HOSPITAL_COMMUNITY)
Admission: EM | Admit: 2018-07-20 | Discharge: 2018-07-20 | Disposition: A | Discharge status: Home / Self Care | Payer: BC Managed Care – PPO | Attending: Family Medicine | Admitting: Family Medicine

## 2018-07-20 DIAGNOSIS — S76311A Strain of muscle, fascia and tendon of the posterior muscle group at thigh level, right thigh, initial encounter: Secondary | ICD-10-CM

## 2018-07-20 DIAGNOSIS — R21 Rash and other nonspecific skin eruption: Secondary | ICD-10-CM

## 2018-07-20 DIAGNOSIS — L290 Pruritus ani: Secondary | ICD-10-CM

## 2018-07-20 MED ORDER — KETOCONAZOLE 2 % EX CREA: 1.0000 "application " | TOPICAL_CREAM | Freq: Two times a day (BID) | CUTANEOUS | 0 refills | Status: DC

## 2018-07-20 NOTE — ED Provider Notes (Signed)
[facility]    CSN: [id number] Arrival date & time: 07/20/18  1558     History   Chief Complaint Chief Complaint  Patient presents with  . Groin Pain    HPI Jason Casey is a 38 y.o. male.   This is a 38 year old man who comes in with abdominal cramping, frequent urination, and rectal itching.  This is his initial visit to [facility] urgent care.  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  Patient developed a hamstring strain in the inner groin area about 1 month ago and is gradually getting better.  He still has some soreness in the medial groin area of the perineum.  Patient also has rectal itching and mild discomfort in the right perineal crease.  He also has some redness in the skin of his posterior scrotum.     History reviewed. No pertinent past medical history.  Patient Active Problem List   Diagnosis Date Noted  . Lumbago 10/27/2011    Past Surgical History:  Procedure Laterality Date  . 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 Authorizing Provider  gabapentin (NEURONTIN) 300 MG capsule Take 1 capsule (300 mg total) by mouth 2 (two) times daily as needed. 02/13/18   Daphine DeutscherMartin, Mary-Margaret, FNP  ketoconazole (NIZORAL) 2 % cream Apply 1 application topically 2 (two) times daily. 07/20/18   Elvina SidleLauenstein, Chistian Kasler, MD  valACYclovir (VALTREX) 1000 MG tablet Take 1 tablet (1,000 mg total) by mouth 3 (three) times daily. 02/13/18   Bennie PieriniMartin, Mary-Margaret, FNP    Family History Family History  Problem Relation Age of Onset  . Fibromyalgia Mother        possibly  . Diabetes Mother        recently diagnosed, checking sugars, no meds  . Hypertension Father   . Hyperlipidemia Father   . Hypertension Brother   . Hyperlipidemia Brother   . Cancer Paternal Grandfather        lung cancer (smoker and asbestos exposure)    Social History Social History   Tobacco Use  . Smoking status: Never Smoker  . Smokeless tobacco: Never Used  Substance Use Topics  . Alcohol use: Yes    Comment: 1-2 drinks some weeks.  . Drug use: No     Allergies   Patient has no known allergies.   Review of Systems Review of Systems  Musculoskeletal: Positive for gait problem.  Skin: Positive for rash.     Physical Exam Triage Vital Signs ED Triage Vitals  Enc Vitals Group     BP      Pulse      Resp      Temp      Temp src      SpO2      Weight      Height      Head Circumference      Peak Flow      Pain Score      Pain Loc      Pain Edu?      Excl. in GC?    No data found.  Updated Vital Signs BP (!) 140/94 (BP Location: Right Arm)   Pulse 82   Temp 98.3 F (36.8 C) (Oral)   Resp 18   SpO2 100%    Physical Exam Vitals signs and  nursing note reviewed.  Constitutional:      Appearance: Normal appearance. He is normal weight.  HENT:     Head: Normocephalic.  Eyes:     Conjunctiva/sclera: Conjunctivae normal.  Neck:     Musculoskeletal: Normal range of motion and neck supple.  Cardiovascular:     Rate and Rhythm: Normal rate.  Pulmonary:     Effort: Pulmonary effort is normal.  Genitourinary:    Penis: Normal.      Scrotum/Testes: Normal.     Comments: Reddened right perineum and inguinal crease with red scrotum Musculoskeletal: Normal range of motion.  Skin:    General: Skin is warm and dry.  Neurological:     General: No focal deficit present.     Mental Status: He is alert.  Psychiatric:        Mood and Affect: Mood normal.      UC Treatments / Results  Labs (all labs ordered are listed, but only abnormal results are displayed) Labs Reviewed - No data to display  EKG None  Radiology No results found.  Procedures Procedures (including critical care time)  Medications Ordered in UC Medications - No data to display  Initial Impression / Assessment and Plan / UC  Course  I have reviewed the triage vital signs and the nursing notes.  Pertinent labs & imaging results that were available during my care of the patient were reviewed by me and considered in my medical decision making (see chart for details).    Final Clinical Impressions(s) / UC Diagnoses   Final diagnoses:  Groin rash  Hamstring strain, right, initial encounter     Discharge Instructions     The 2 exercises to improve strength in the hamstrings include the hip raises with active hip flexion, one leg at a time.  The other exercise is going up and down from a step landing on your heel and slowly raising the foot to the step level.  The urine test is negative.  The rash in the groin is a mild fungal rash and involves the skin of the groin and gluteal cleft, causing irritation and itching.  The prescribed cream should clear this up in 5-7 days.    ED Prescriptions    Medication Sig Dispense Auth. Provider   ketoconazole (NIZORAL) 2 % cream Apply 1 application topically 2 (two) times daily. 30 g Elvina Sidle, MD     Controlled Substance Prescriptions [place] Controlled Substance Registry consulted? Not Applicable   Elvina Sidle, MD 07/20/18 [phone]

## 2018-07-20 NOTE — ED Triage Notes (Signed)
Pt sts some groin pain, rectal itching and clear urine

## 2018-07-20 NOTE — Discharge Instructions (Addendum)
The 2 exercises to improve strength in the hamstrings include the hip raises with active hip flexion, one leg at a time.  The other exercise is going up and down from a step landing on your heel and slowly raising the foot to the step level.  The urine test is negative.  The rash in the groin is a mild fungal rash and involves the skin of the groin and gluteal cleft, causing irritation and itching.  The prescribed cream should clear this up in 5-7 days.

## 2018-07-20 NOTE — Progress Notes (Signed)
Based on what you shared with me, I feel your condition warrants further evaluation and I recommend that you be seen for a face to face office visit.     NOTE: If you entered your credit card information for this eVisit, you will not be charged. You may see a "hold" on your card for the $35 but that hold will drop off and you will not have a charge processed.  If you are having a true medical emergency please call 911.  If you need an urgent face to face visit, [place] has four urgent care centers for your convenience.    PLEASE NOTE: THE [facility] LOCATIONS AND URGENT CARE CLINICS DO NOT HAVE THE TESTING FOR CORONAVIRUS COVID19 AVAILABLE.  IF YOU FEEL YOU NEED THIS TEST YOU MUST GO TO A TRIAGE LOCATION AT ONE OF THE HOSPITAL EMERGENCY DEPARTMENTS ?  [email or web address] to reserve your spot online an avoid wait times  [facility] [address], [address] [place], [place] [address] Modified hours of operation: Monday-Friday, 12 PM to 6 PM  Saturday & Sunday 10 AM to 4 PM *Across the street from [employer]  [employer] (New Address!) [address] [place], [place] [address] *Just off [address], across the road from [place]* Modified hours of operation: Monday-Friday, 12 PM to 6 PM  Closed Saturday & Sunday  [facility]'s modified hours of operation will be in effect from May 1 until May 31   The following sites will take your insurance:  [facility]  [phone] Get Driving Directions Find a Provider at this Location  [address] [place], [place] [address] 10 am to 8 pm Monday-Friday 12 pm to 8 pm [facility] at [facility]  [phone] Get Driving Directions Find a Provider at this Location  [address] [place], [place] [address] 8 am to 8 pm Monday-Friday 9 am to 6 pm Saturday 11 am to 6 pm Sunday   [facility]  at [facility]  [phone] Get Driving Directions  [address] [place], [place] [address] 8 am to 8 pm Monday-Friday 8 am to 4 pm Saturday-Sunday   Your e-visit answers were reviewed by a board certified advanced clinical practitioner to complete your personal care plan.  Thank you for using e-Visits.  ===View-only below this line===   ----- Message -----    From: Chrisandra Netters    Sent: 07/20/2018  1:32 PM EDT      To: E-Visit Mailing List Subject: E-Visit Submission: Urinary Problems  E-Visit Submission: Urinary Problems --------------------------------  Question: Which of the following are you experiencing? Answer:   Change in urine appearance or smell  Question: Are you able to pass urine? Answer:   Yes, I can pass urine.  Question: How long have you had pain or difficulty passing urine? Answer:   Two days or less  Question: Do you have a fever? Answer:   No, I do not have a fever  Question: Do you have any of the following? Answer:   I have none of these problems  Question: Do you have an exaggerated sensation of the need to pass urine? Answer:   No, the sensation is normal  Question: Do you have the urge to urinate more of less frequently than normal? Answer:   More frequently  Question: What does your urine look like? Answer:   It is clear  Question: Do you have  any of the following? Answer:   None of the above  Question: Do you have any of the following? Answer:   No discharge  Question: Do you have any sores on your genitals? Answer:   No  Question: Do you have any history of kidney dysfunction or kidney problems? Answer:   No  Question: Within the past 3 months, have you had any surgery on your kidneys or bladder, or have you had a tube inserted to collect your urine? Answer:   No, I have never had either  Question: Have you had similar symptoms in the past? Answer:   Yes, I have had similar symptoms before  Question: If you had  similar symptoms in the past, did any of the following work? Answer:   None of the above  Question: Please list any additional comments  Answer:   I have had some very mild cramping sensations in my groin that come and go. I also have a very itchy rectum that is difficult to resist from scratching, especially early in the morning and at night. I just have the general sensation that something is "off."  Question: Please list your medication allergies that you may have ? (If 'none' , please list as 'none') Answer:   None

## 2018-09-09 ENCOUNTER — Encounter: Payer: Self-pay | Admitting: Family Medicine

## 2018-09-09 ENCOUNTER — Ambulatory Visit: Payer: BC Managed Care – PPO | Admitting: Family Medicine

## 2018-09-09 ENCOUNTER — Other Ambulatory Visit: Payer: Self-pay

## 2018-09-09 ENCOUNTER — Telehealth: Payer: BC Managed Care – PPO | Admitting: Physician Assistant

## 2018-09-09 VITALS — Temp 97.1°F | Ht 69.0 in | Wt 192.0 lb

## 2018-09-09 DIAGNOSIS — R42 Dizziness and giddiness: Secondary | ICD-10-CM

## 2018-09-09 DIAGNOSIS — R140 Abdominal distension (gaseous): Secondary | ICD-10-CM | POA: Diagnosis not present

## 2018-09-09 DIAGNOSIS — J309 Allergic rhinitis, unspecified: Secondary | ICD-10-CM

## 2018-09-09 NOTE — Progress Notes (Signed)
Based on what you shared with me, I feel your condition warrants further evaluation and I recommend that you be seen for a face to face office visit. We only treat motion sickness (aka cruise ship, boats, planes and cars) and do not assess/treat ongoing dizziness episodes via e-visit. You need to be seen by your PCP and Urgent Care or ER setting today if possible.   NOTE: If you entered your credit card information for this eVisit, you will not be charged. You may see a "hold" on your card for the $35 but that hold will drop off and you will not have a charge processed.  If you are having a true medical emergency please call 911.     For an urgent face to face visit, [place] has five urgent care centers for your convenience:    [email or web address] to reserve your spot online an avoid wait times  [facility] [address], [address] [place], [place] [address] Modified hours of operation: Monday-Friday, 12 PM to 6 PM  Closed Saturday & Sunday  *Across the street from [place] (New Address!) [address], [place], [place] [address] *Just off [employer], across the road from [place] hours of operation: Monday-Friday, 12 PM to 6 PM  Closed Saturday & Sunday   The following sites will take your insurance:  . [facility]    [phone]                  Get Driving Directions  [address], [place] [address] . 10 am to 8 pm Monday-Friday . 12 pm to 8 pm Saturday-Sunday   . [facility] at [place]                  Get Driving Directions  [address], [place], [place] [address] . 8 am to 8 pm Monday-Friday . 9 am to 6 pm Saturday . 11 am to 6 pm Sunday   . [facility] at [place]                  Get Driving Directions   [address] [place], [place] [address]  . 8 am to 8 pm Monday-Friday . 8 am to 4 pm Saturday-Sunday    . [facility] at [place]                    Get Driving Directions  [phone]  [address] [place],  [address]  . Monday-Friday, 12 PM to 6 PM    Your e-visit answers were reviewed by a board certified advanced clinical practitioner to complete your personal care plan.  Thank you for using e-Visits.

## 2018-09-09 NOTE — Progress Notes (Signed)
Start time: 3:33 End time: 3:56 Virtual Visit via Video Note  I connected with Jason Casey on 09/09/18 at  3:00 PM EDT by a video enabled telemedicine application and verified that I am speaking with the correct person using two identifiers.  Location: Patient: home Provider: office   I discussed the limitations of evaluation and management by telemedicine and the availability of in person appointments. The patient expressed understanding and agreed to proceed. He consents to insurance being filed for this visit.  History of Present Illness:  Chief Complaint  Patient presents with  . Dizziness    started June 17th, just started feeling off. Fatigue. Has had a headache on two separate occasions. Has had some abdominal discomfort-gas, full stomach. Most prevelant feeling is dizziness/feeling of being off. (had life insurance physical and his bp was referenced as "beautiful!"   Ongoing problems with feeling a little woozy.  Started 3 weeks ago, hasn't gotten worse, is intermittent.  He is having some postnasal drainage.  No runny nose, some slight pressure at his right cheek, slightly on the left.  He had a couple of headaches, one was last week, started on the left side of his nose, up to the forehead.  Ears fill a little "foggy", some clicking/popping.  Left ear feels a little plugged.  He took excedrin migraine when he had the headache, didn't really notice much improvement as he went to bed, but woke up better.  No fever or chills, no discolored drainage. Sometimes coughs up "slimy" mucus, white.  Took xyzal until the end of May for allergies, stopped it as that's when allergies are usually better for him.  He has had some upset stomach a few times, gassy.  Had nausea a couple of times, possibly from PND.  Was shortlived Normal bowel movements Today was gassy when he woke up. [place] with his nephew.  No other significant dietary changes.  PMH, PSH,  SH reviewed  Outpatient Encounter Medications as of 09/09/2018  Medication Sig  . [DISCONTINUED] gabapentin (NEURONTIN) 300 MG capsule Take 1 capsule (300 mg total) by mouth 2 (two) times daily as needed.  . [DISCONTINUED] ketoconazole (NIZORAL) 2 % cream Apply 1 application topically 2 (two) times daily.  . [DISCONTINUED] valACYclovir (VALTREX) 1000 MG tablet Take 1 tablet (1,000 mg total) by mouth 3 (three) times daily.   No facility-administered encounter medications on file as of 09/09/2018.    No Known Allergies ROS: no fever, chills, chest pain, cough, shortness of breath, vomiting, diarrhea.  Some GI effects and upper respiratory symptoms and off balance feeling as per HPI.  No other concerns.    Observations/Objective:  Temp (!) 97.1 F (36.2 C) (Oral)   Ht 5\' 9"  (1.753 m)   Wt 192 lb (87.1 kg)   BMI 28.35 kg/m    Pleasant, well-appearing male, in good spirits, in no distress He is alert, oriented, cranial nerves are grossly intact. He doesn't sound congested or nasal, no coughing. Normal eye contact, speech, grooming Exam is limited due to the virtual nature of the visit.   Assessment and Plan:  Allergic rhinitis, unspecified seasonality, unspecified trigger   Gassiness   He wants to schedule a physical--will try and find a spot, put on cancellation list.   Follow Up Instructions:    I discussed the assessment and treatment plan with the patient. The patient was provided an opportunity to ask questions and all were answered. The patient agreed with the  plan and demonstrated an understanding of the instructions.   The patient was advised to call back or seek an in-person evaluation if the symptoms worsen or if the condition fails to improve as anticipated.  I provided 23 minutes of non-face-to-face time during this encounter.   Lavonda JumboEve A Charlie Char, MD

## 2018-09-09 NOTE — Patient Instructions (Signed)
Please re-start taking a daily antihistamine such as Xyzal (or claritin or zyrtec or allegra).  It sounds as though you likely have nasal congestion, postnasal drainage, and possibly some eustachian tube symptoms which are likely related to allergies. You can use sudafed in addition to the antihistamine, if needed, for any sinus pain or ear pain. You can try sinus rinses (sinus rinse kit or neti-pot) if needed for sinus pain (or to flush out the sinuses and help treat the sensation in the throat you described).  We discussed some gassiness--this can be related to changes in the diet such as a lot of dairy, greasy foods, or more gas-producing foods such as beans, and greens (especially raw vegetables). Try eating a bland diet, consider taking probitiotics daily for a couple of weeks, to see if you can reset things in your stomach to get it back to normal.  Try and pay attention if there are particular triggers for your abdominal symptoms.  (we also discussed that postnasal drainage could somewhat affect your stomach--nausea, upsetting stomach).  Please contact me if you develop worsening symptoms--fever, discolored mucus, sinus pain, cough, shortness of breath.  We briefly discussed using meclizine (available over-the-counter) if you develop true vertigo (room spinning dizziness).  Hopefully that won't happen!  Continue to stay well, and we will try and find a spot to get you in for a physical soon.

## 2018-09-25 ENCOUNTER — Encounter: Payer: Self-pay | Admitting: Family Medicine

## 2018-09-25 NOTE — Patient Instructions (Addendum)
HEALTH MAINTENANCE RECOMMENDATIONS:  It is recommended that you get at least 30 minutes of aerobic exercise at least 5 days/week (for weight loss, you may need as much as 60-90 minutes). This can be any activity that gets your heart rate up. This can be divided in 10-15 minute intervals if needed, but try and build up your endurance at least once a week.  Weight bearing exercise is also recommended twice weekly.  Eat a healthy diet with lots of vegetables, fruits and fiber.  "Colorful" foods have a lot of vitamins (ie green vegetables, tomatoes, red peppers, etc).  Limit sweet tea, regular sodas and alcoholic beverages, all of which has a lot of calories and sugar.  Up to 2 alcoholic drinks daily may be beneficial for men (unless trying to lose weight, watch sugars).  Drink a lot of water.  Sunscreen of at least SPF 30 should be used on all sun-exposed parts of the skin when outside between the hours of 10 am and 4 pm (not just when at beach or pool, but even with exercise, golf, tennis, and yard work!)  Use a sunscreen that says "broad spectrum" so it covers both UVA and UVB rays, and make sure to reapply every 1-2 hours.  Remember to change the batteries in your smoke detectors when changing your clock times in the spring and fall. Carbon monoxide detectors are recommended for your home.  Use your seat belt every time you are in a car, and please drive safely and not be distracted with cell phones and texting while driving.  Consider adding omega-3 fish oil (lowers triglycerides). Be sure to eat a high fiber diet and drink plenty of water (to help keep your bowel movements regular.   High-Fiber Diet Fiber, also called dietary fiber, is a type of carbohydrate that is found in fruits, vegetables, whole grains, and beans. A high-fiber diet can have many health benefits. Your health care provider may recommend a high-fiber diet to help:  Prevent constipation. Fiber can make your bowel movements  more regular.  Lower your cholesterol.  Relieve the following conditions: ? Swelling of veins in the anus (hemorrhoids). ? Swelling and irritation (inflammation) of specific areas of the digestive tract (uncomplicated diverticulosis). ? A problem of the large intestine (colon) that sometimes causes pain and diarrhea (irritable bowel syndrome, IBS).  Prevent overeating as part of a weight-loss plan.  Prevent heart disease, type 2 diabetes, and certain cancers. What is my plan? The recommended daily fiber intake in grams (g) includes:  38 g for men age 38 or younger.  30 g for men over age 38.  25 g for women age 38 or younger.  21 g for women over age 38. You can get the recommended daily intake of dietary fiber by:  Eating a variety of fruits, vegetables, grains, and beans.  Taking a fiber supplement, if it is not possible to get enough fiber through your diet. What do I need to know about a high-fiber diet?  It is better to get fiber through food sources rather than from fiber supplements. There is not a lot of research about how effective supplements are.  Always check the fiber content on the nutrition facts label of any prepackaged food. Look for foods that contain 5 g of fiber or more per serving.  Talk with a diet and nutrition specialist (dietitian) if you have questions about specific foods that are recommended or not recommended for your medical condition, especially if those foods are  not listed below.  Gradually increase how much fiber you consume. If you increase your intake of dietary fiber too quickly, you may have bloating, cramping, or gas.  Drink plenty of water. Water helps you to digest fiber. What are tips for following this plan?  Eat a wide variety of high-fiber foods.  Make sure that half of the grains that you eat each day are whole grains.  Eat breads and cereals that are made with whole-grain flour instead of refined flour or white flour.  Eat  brown rice, bulgur wheat, or millet instead of white rice.  Start the day with a breakfast that is high in fiber, such as a cereal that contains 5 g of fiber or more per serving.  Use beans in place of meat in soups, salads, and pasta dishes.  Eat high-fiber snacks, such as berries, raw vegetables, nuts, and popcorn.  Choose whole fruits and vegetables instead of processed forms like juice or sauce. What foods can I eat?  Fruits Berries. Pears. Apples. Oranges. Avocado. Prunes and raisins. Dried figs. Vegetables Sweet potatoes. Spinach. Kale. Artichokes. Cabbage. Broccoli. Cauliflower. Green peas. Carrots. Squash. Grains Whole-grain breads. Multigrain cereal. Oats and oatmeal. Brown rice. Barley. Bulgur wheat. [place]. Quinoa. Bran muffins. Popcorn. Rye wafer crackers. Meats and other proteins Navy, kidney, and pinto beans. Soybeans. Split peas. Lentils. Nuts and seeds. Dairy Fiber-fortified yogurt. Beverages Fiber-fortified soy milk. Fiber-fortified orange juice. Other foods Fiber bars. The items listed above may not be a complete list of recommended foods and beverages. Contact a dietitian for more options. What foods are not recommended? Fruits Fruit juice. Cooked, strained fruit. Vegetables Fried potatoes. Canned vegetables. Well-cooked vegetables. Grains White bread. Pasta made with refined flour. White rice. Meats and other proteins Fatty cuts of meat. Fried chicken or fried fish. Dairy Milk. Yogurt. Cream cheese. Sour cream. Fats and oils Butters. Beverages Soft drinks. Other foods Cakes and pastries. The items listed above may not be a complete list of foods and beverages to avoid. Contact a dietitian for more information. Summary  Fiber is a type of carbohydrate. It is found in fruits, vegetables, whole grains, and beans.  There are many health benefits of eating a high-fiber diet, such as preventing constipation, lowering blood cholesterol, helping with weight  loss, and reducing your risk of heart disease, diabetes, and certain cancers.  Gradually increase your intake of fiber. Increasing too fast can result in cramping, bloating, and gas. Drink plenty of water while you increase your fiber.  The best sources of fiber include whole fruits and vegetables, whole grains, nuts, seeds, and beans. This information is not intended to replace advice given to you by your health care provider. Make sure you discuss any questions you have with your health care provider. Document Released: 02/17/2005 Document Revised: 12/22/2016 Document Reviewed: 12/22/2016 Elsevier Patient Education  Secret[employer].    High Triglycerides Eating Plan Triglycerides are a type of fat in the blood. High levels of triglycerides can increase your risk of heart disease and stroke. If your triglyceride levels are high, choosing the right foods can help lower your triglycerides and keep your heart healthy. Work with your health care provider or a diet and nutrition specialist (dietitian) to develop an eating plan that is right for you. What are tips for following this plan? General guidelines   Lose weight, if you are overweight. For most people, losing 5-10 lbs (2-5 kg) helps lower triglyceride levels. A weight-loss plan may include. ? 30 minutes of  exercise at least 5 days a week. ? Reducing the amount of calories, sugar, and fat you eat.  Eat a wide variety of fresh fruits, vegetables, and whole grains. These foods are high in fiber.  Eat foods that contain healthy fats, such as fatty fish, nuts, seeds, and olive oil.  Avoid foods that are high in added sugar, added salt (sodium), saturated fat, and trans fat.  Avoid low-fiber, refined carbohydrates such as white bread, crackers, noodles, and white rice.  Avoid foods with partially hydrogenated oils (trans fats), such as fried foods or stick margarine.  Limit alcohol intake to no more than 1 drink a day for nonpregnant  women and 2 drinks a day for men. One drink equals 12 oz of beer, 5 oz of wine, or 1 oz of hard liquor. Your health care provider may recommend that you drink less depending on your overall health. Reading food labels  Check food labels for the amount of saturated fat. Choose foods with no or very little saturated fat.  Check food labels for the amount of trans fat. Choose foods with no trans fat.  Check food labels for the amount of cholesterol. Choose foods low in cholesterol. Ask your dietitian how much cholesterol you should have each day.  Check food labels for the amount of sodium. Choose foods with less than 140 milligrams (mg) per serving. Shopping  Buy dairy products labeled as nonfat (skim) or low-fat (1%).  Avoid buying processed or prepackaged foods. These are often high in added sugar, sodium, and fat. Cooking  Choose healthy fats when cooking, such as olive oil or canola oil.  Cook foods using lower fat methods, such as baking, broiling, boiling, or grilling.  Make your own sauces, dressings, and marinades when possible, instead of buying them. Store-bought sauces, dressings, and marinades are often high in sodium and sugar. Meal planning  Eat more home-cooked food and less restaurant, buffet, and fast food.  Eat fatty fish at least 2 times each week. Examples of fatty fish include salmon, trout, mackerel, tuna, and herring.  If you eat whole eggs, do not eat more than 3 egg yolks per week. What foods are recommended? The items listed may not be a complete list. Talk with your dietitian about what dietary choices are best for you. Grains Whole wheat or whole grain breads, crackers, cereals, and pasta. Unsweetened oatmeal. Bulgur. Barley. Quinoa. Brown rice. Whole wheat flour tortillas. Vegetables Fresh or frozen vegetables. Low-sodium canned vegetables. Fruits All fresh, canned (in natural juice), or frozen fruits. Meats and other protein foods Skinless chicken or  [place]. Ground chicken or [place]. Lean cuts of pork, trimmed of fat. Fish and seafood, especially salmon, trout, and herring. Egg whites. Dried beans, peas, or lentils. Unsalted nuts or seeds. Unsalted canned beans. Natural peanut or almond butter. Dairy Low-fat dairy products. Skim or low-fat (1%) milk. Reduced fat (2%) and low-sodium cheese. Low-fat ricotta cheese. Low-fat cottage cheese. Plain, low-fat yogurt. Fats and oils Tub margarine without trans fats. Light or reduced-fat mayonnaise. Light or reduced-fat salad dressings. Avocado. Safflower, olive, sunflower, soybean, and canola oils. What foods are not recommended? The items listed may not be a complete list. Talk with your dietitian about what dietary choices are best for you. Grains White bread. White (regular) pasta. White rice. Cornbread. Bagels. Pastries. Crackers that contain trans fat. Vegetables Creamed or fried vegetables. Vegetables in a cheese sauce. Fruits Sweetened dried fruit. Canned fruit in syrup. Fruit juice. Meats and other protein foods Fatty  cuts of meat. Ribs. Chicken wings. Tomasa BlaseBacon. Sausage. Bologna. Salami. Chitterlings. Fatback. Hot dogs. Bratwurst. Packaged lunch meats. Dairy Whole or reduced-fat (2%) milk. Half-and-half. Cream cheese. Full-fat or sweetened yogurt. Full-fat cheese. Nondairy creamers. Whipped toppings. Processed cheese or cheese spreads. Cheese curds. Beverages Alcohol. Sweetened drinks, such as soda, lemonade, fruit drinks, or punches. Fats and oils Butter. Stick margarine. Lard. Shortening. Ghee. Bacon fat. Tropical oils, such as coconut, palm kernel, or palm oils. Sweets and desserts Corn syrup. Sugars. Honey. Molasses. Candy. Jam and jelly. Syrup. Sweetened cereals. Cookies. Pies. Cakes. Donuts. Muffins. Ice cream. Condiments Store-bought sauces, dressings, and marinades that are high in sugar, such as ketchup and barbecue sauce. Summary  High levels of triglycerides can increase the risk  of heart disease and stroke. Choosing the right foods can help lower your triglycerides.  Eat plenty of fresh fruits, vegetables, and whole grains. Choose low-fat dairy and lean meats. Eat fatty fish at least twice a week.  Avoid processed and prepackaged foods with added sugar, sodium, saturated fat, and trans fat.  If you need suggestions or have questions about what types of food are good for you, talk with your health care provider or a dietitian. This information is not intended to replace advice given to you by your health care provider. Make sure you discuss any questions you have with your health care provider. Document Released: 12/06/2003 Document Revised: 01/30/2017 Document Reviewed: 04/22/2016 Elsevier Patient Education  2020 [employer]

## 2018-09-25 NOTE — Progress Notes (Signed)
Chief Complaint  Patient presents with  . Annual Exam  . nipple pain    right nipple pain   . Groin Pain    cramping sensation sometimes, found pea size lump not on testicles   . Numbness    left hand and arm numbness     Jason Casey is a 38 y.o. male who presents for a complete physical.  He has the following concerns:  He intermittently gets a weird sensation/pain over the right nipple.  Denies masses.  Never looks red or irritated.  When it is painful, it feels harder (below the nipple).  Other times has discomfort higher in the chest, over the muscle. Can't find that it is related to a particular activity (pushups, running).  Notices a small lump in his scrotum, on the right, not testicular.  Noted incidentally (when checking due to some groin pain).  Denies any bulging.  Groin area hurts more with straining.   Numbness--left arm, notices with side stretch in yoga, with left arm elevated, affecting the left 3rd through 5th fingers.  Usually short-lived. Also occurs while on his back in bed, looking at his phone.  Ongoing for many months (or longer), comes and goes.  Only got worried about it when he also got lightheaded recently. Toes also fall asleep sometimes--if shoes are too tight, while walking, with crossed legs. Denies vision loss/changes, or other neurologic symptoms.  Stools have been inconsistent--sometimes hard/dry, has to strain some, other times it is looser. Thinks he may have some IBS. Had a lot of stress, but once he had his job placement, feels a little better.  Chart reviewed of care in the last year:  Recently had virtual visit where we discussed dizziness and allergies, as well as some gassiness. Dizziness subsided (lasted about 10 days after, none since a week ago).  He went to [facility] in May with hamstring strain and fungal infection (tinea cruris), treated with ketoconazole cream. It still itches, and is using antifungal tea tree body wash.  He still has some itching  at the perineum and rectal area.  Shingles 01/2018. It was a mild case, at his right lower back/abdomen.  Immunization History  Administered Date(s) Administered  . DTaP 11/12/1980, 02/14/1981, 04/30/1981, 06/20/1982  . IPV 11/12/1980, 02/14/1981, 04/30/1981, 06/20/1982  . Influenza-Unspecified 12/03/2017  . MMR 01/04/1982, 09/21/2017  . PPD Test 09/21/2017  . Tdap 09/21/2017   Last colonoscopy: never Ophtho: never Dentist:  Twice yearly Exercise: Yoga daily.  Running (getting back into it after a long break) 3x/week.  Pushups, pullups, and arm exercises with weights (restarting).  Brought in recent labs for insurance-- 08/30/2018:  Had fasted 6 hours Chol 202, HDL 42.7, LDL 118, TG 205, ratio 4.73 A1c 5% Cr 1.2 LFT's normal (no CBC done, no TSH)  Past Medical History:  Diagnosis Date  . Shingles 01/2018    Past Surgical History:  Procedure Laterality Date  . 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 Marital status: Married    Spouse name: Not on file  . Number of children: Not on file  . Years of education: Not on file  . Highest education level: Not on file  Occupational History  . Occupation: Product manager: [employer]  . Financial resource strain: Not on file  . Food insecurity    Worry: Not on file    Inability: Not  on file  . Transportation needs    Medical: Not on file    Non-medical: Not on file  Tobacco Use  . Smoking status: Never Smoker  . Smokeless tobacco: Never Used  Substance and Sexual Activity  . Alcohol use: Yes    Comment: 1-2 drinks some weeks.  . Drug use: No  . Sexual activity: Yes    Partners: Female  Lifestyle  . Physical activity    Days per week: Not on file    Minutes per session: Not on file  . Stress: Not on file  Relationships  . Social Herbalist on phone: Not on file    Gets together: Not on file    Attends  religious service: Not on file    Active member of club or organization: Not on file    Attends meetings of clubs or organizations: Not on file    Relationship status: Not on file  . Intimate partner violence    Fear of current or ex partner: Not on file    Emotionally abused: Not on file    Physically abused: Not on file    Forced sexual activity: Not on file  Other Topics Concern  . Not on file  Social History Narrative   Married, 1 dog, 1 cat.     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 Fibromyalgia Mother        possibly  . Diabetes Mother   . Hypertension Father   . Hyperlipidemia Father   . Hypertension Brother   . Hyperlipidemia Brother   . Cancer Paternal Grandfather        lung cancer (smoker and asbestos exposure)  . COPD Maternal Grandmother     Outpatient Encounter Medications as of 09/27/2018  Medication Sig  . levocetirizine (XYZAL) 5 MG tablet Take 5 mg by mouth every evening.   No facility-administered encounter medications on file as of 09/27/2018.    No Known Allergies  ROS:  The patient denies anorexia, fever, vision loss, decreased hearing, ear pain, hoarseness, chest pain (just as mentioned in HPI, non-exertional), palpitations, dizziness, syncope, dyspnea on exertion, cough, swelling, nausea, vomiting, abdominal pain, melena, hematochezia, indigestion/heartburn, hematuria, incontinence, erectile dysfunction, nocturia, weakened urine stream, dysuria, genital lesions, joint pains, weakness, tremor, suspicious skin lesions, depression, anxiety, abnormal bleeding/bruising, or enlarged lymph nodes. Bowel changes per HPI Numbness per HPI Allergies are controlled Dizziness has resolved He had lost weight, and  regained some back during quarantine--he reports weight was up to 211# in 01/2018, got down to 184# and regained during quarantine.    PHYSICAL EXAM:  BP 120/82   Pulse 65   Temp 98.3 F (36.8 C) (Oral)   Ht '5\' 9"'  (1.753 m)   Wt 202 lb 12.8 oz (92 kg)   SpO2 97%   BMI 29.95 kg/m   Wt Readings from Last 3 Encounters:  09/27/18 202 lb 12.8 oz (92 kg)  09/09/18 192 lb (87.1 kg)  09/21/17 202 lb (91.6 kg)    General Appearance:    Alert, cooperative, no distress, appears stated age  Head:    Normocephalic, without obvious abnormality, atraumatic  Eyes:    PERRL, conjunctiva/corneas clear, EOM's intact, fundi    benign  Ears:  Normal TM's and external ear canals  Nose:   Not examined (wearing mask due to COVID-19 pandemic)  Throat:   Not examined (wearing mask due to COVID-19 pandemic)  Neck:   Supple, no lymphadenopathy;  thyroid:  no enlargement/ tenderness/nodules; no carotid bruit or JVD  Back:    Spine nontender, no curvature, ROM normal, no CVA  tenderness.   Lungs:     Clear to auscultation bilaterally without wheezes, rales or   ronchi; respirations unlabored  Chest Wall:    No tenderness or deformity.  Area of occasional discomfort is over right pectoralis muscle, nontender today.   Heart:    Regular rate and rhythm, S1 and S2 normal, no murmur, rub   or gallop  Breast Exam:    No chest wall tenderness, masses or gynecomastia. Nipples are normal, nontender.   Abdomen:     Soft, non-tender, nondistended, normoactive bowel sounds,    no masses, no hepatosplenomegaly  Genitalia:    Normal male external genitalia without lesions.  Testicles without masses. Very small, nontender cyst noted in R scrotum.  Slight (minimal) discomfort at the right inguinal canal; no bulge or hernia present.  Rectal:   Deferred due to age <40 and lack of symptoms  Extremities:   No clubbing, cyanosis or edema. nontender at left elbow, ulnar nerve  Pulses:   2+ and symmetric all extremities   Skin:   Skin color, texture, turgor normal, no lesions or rashes. Slight sunburn noted at right shoulder and right chest (erythema only)  Lymph nodes:   Cervical, supraclavicular, and axillary nodes normal  Neurologic:   CNII-XII intact, normal strength, sensation and gait; reflexes 2+ and symmetric throughout                                Psych:   Normal mood, affect, hygiene and grooming.   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 Can call if wants recheck in 3-6 mos vs waiting until CPE next year    Recommended at least 30 minutes of aerobic activity at least 5 days/week, weight-bearing exercise at least 2x/week; proper sunscreen use reviewed; healthy diet and alcohol recommendations (less than or equal to 2 drinks/day) reviewed; regular seatbelt use; changing batteries in smoke detectors, having carbon monoxide detectors. Self-testicular exams. Immunization recommendations discussed, yearly flu shots recommended.   F/u 1 year, sooner prn (can call for lab visit if he would like lipids rechecked in 3-6 months rather than waiting until CPE next year)

## 2018-09-27 ENCOUNTER — Encounter: Payer: Self-pay | Admitting: Family Medicine

## 2018-09-27 ENCOUNTER — Other Ambulatory Visit: Payer: Self-pay

## 2018-09-27 ENCOUNTER — Ambulatory Visit: Payer: BC Managed Care – PPO | Admitting: Family Medicine

## 2018-09-27 VITALS — BP 120/82 | HR 65 | Temp 98.3°F | Ht 69.0 in | Wt 202.8 lb

## 2018-09-27 DIAGNOSIS — Z0000 Encounter for general adult medical examination without abnormal findings: Secondary | ICD-10-CM

## 2018-09-27 DIAGNOSIS — Z6829 Body mass index (BMI) 29.0-29.9, adult: Secondary | ICD-10-CM | POA: Diagnosis not present

## 2018-09-27 DIAGNOSIS — G5622 Lesion of ulnar nerve, left upper limb: Secondary | ICD-10-CM | POA: Diagnosis not present

## 2018-09-27 DIAGNOSIS — E781 Pure hyperglyceridemia: Secondary | ICD-10-CM

## 2018-09-27 DIAGNOSIS — K5900 Constipation, unspecified: Secondary | ICD-10-CM | POA: Diagnosis not present

## 2018-09-28 LAB — CBC WITH DIFFERENTIAL/PLATELET
Basophils Absolute: 0.1 10*3/uL (ref 0.0–0.2)
Basos: 1 %
EOS (ABSOLUTE): 0.2 10*3/uL (ref 0.0–0.4)
Eos: 3 %
Hematocrit: 47.4 % (ref 37.5–51.0)
Hemoglobin: 15.9 g/dL (ref 13.0–17.7)
Immature Grans (Abs): 0 10*3/uL (ref 0.0–0.1)
Immature Granulocytes: 1 %
Lymphocytes Absolute: 2.6 10*3/uL (ref 0.7–3.1)
Lymphs: 35 %
MCH: 31.9 pg (ref 26.6–33.0)
MCHC: 33.5 g/dL (ref 31.5–35.7)
MCV: 95 fL (ref 79–97)
Monocytes Absolute: 0.5 10*3/uL (ref 0.1–0.9)
Monocytes: 7 %
Neutrophils Absolute: 4 10*3/uL (ref 1.4–7.0)
Neutrophils: 53 %
Platelets: 292 10*3/uL (ref 150–450)
RBC: 4.99 x10E6/uL (ref 4.14–5.80)
RDW: 12.8 % (ref 11.6–15.4)
WBC: 7.4 10*3/uL (ref 3.4–10.8)

## 2018-11-03 ENCOUNTER — Encounter: Payer: Self-pay | Admitting: Family Medicine

## 2018-12-17 ENCOUNTER — Encounter: Payer: Self-pay | Admitting: Family Medicine

## 2019-03-04 ENCOUNTER — Encounter: Payer: Self-pay | Admitting: Family Medicine

## 2019-03-04 DIAGNOSIS — R194 Change in bowel habit: Secondary | ICD-10-CM

## 2019-03-07 ENCOUNTER — Encounter: Payer: Self-pay | Admitting: Gastroenterology

## 2019-03-17 ENCOUNTER — Ambulatory Visit: Payer: BC Managed Care – PPO | Admitting: Gastroenterology

## 2019-03-17 ENCOUNTER — Encounter: Payer: Self-pay | Admitting: Gastroenterology

## 2019-03-17 VITALS — BP 132/80 | HR 88 | Temp 97.9°F | Ht 69.0 in | Wt 210.2 lb

## 2019-03-17 DIAGNOSIS — K625 Hemorrhage of anus and rectum: Secondary | ICD-10-CM

## 2019-03-17 DIAGNOSIS — R194 Change in bowel habit: Secondary | ICD-10-CM

## 2019-03-17 DIAGNOSIS — Z01818 Encounter for other preprocedural examination: Secondary | ICD-10-CM

## 2019-03-17 MED ORDER — SUTAB 1479-225-188 MG PO TABS: 1.0000 | ORAL_TABLET | Freq: Once | ORAL | 0 refills | Status: AC

## 2019-03-17 MED ORDER — CITRUCEL PO POWD: 1.0000 | Freq: Every day | ORAL | Status: DC

## 2019-03-17 NOTE — Patient Instructions (Addendum)
If you are age 39 or older, your body mass index should be between 23-30. Your Body mass index is 31.05 kg/m. If this is out of the aforementioned range listed, please consider follow up with your Primary Care Provider.  If you are age 13 or younger, your body mass index should be between 19-25. Your Body mass index is 31.05 kg/m. If this is out of the aformentioned range listed, please consider follow up with your Primary Care Provider.   You have been scheduled for a colonoscopy. Please follow written instructions given to you at your visit today.  Please pick up your prep supplies at the pharmacy within the next 1-3 days. If you use inhalers (even only as needed), please bring them with you on the day of your procedure.  We are giving you a sample of Sutab for your colonoscopy prep.   Please purchase the following medications over the counter and take as directed: Citrucel powder: Take daily   Thank you for entrusting me with your care and for choosing [facility], Dr. Ileene Patrick

## 2019-03-17 NOTE — Progress Notes (Signed)
HPI :  39 year old healthy male referred by Dr. Rita Ohara for change in bowel habits.  He states at baseline his bowel habits historically have been very normal and regular.  For the past few months he has had changes of bowels where he will have a loose bowel for a few days, then normal stools, then constipated stools, and then the cycle repeats itself.  He has changed his diet but that has not provided any benefit so far.  He does see some mucus in his stools at time and has occasional bleeding which bothers him.  He sees bright red blood in the stool occasionally.  He has some pruritus in the perianal area and thinks it may be jock itch.  He also had some right lower quadrant discomfort in August.  He states it comes and goes, does not feel it every day.  He has some bloating in his abdomen as well.  He has some cramping in the anal area at times as well.  Does not seem to be associated with his bowel movements or the bleeding episodes, can occur sporadically.  When he has a loose stool he does have some associated urgency with this.  He denies any weight loss.  No known family history of Crohn's disease or colitis.  No family history of colon cancer.  He has an occasional heartburn which bothers him rarely.  He does not take much of any antacids.  No dysphagia.  No postprandial abdominal pain in the upper abdomen.  No history of anemia.  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 Shingles 01/2018     Past Surgical History:  Procedure Laterality Date  . 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 Fibromyalgia Mother        possibly  . Diabetes Mother   . Hypertension Father   . Hyperlipidemia Father   . Hypertension Brother   . Hyperlipidemia Brother   . Cancer Paternal Grandfather        lung cancer (smoker and asbestos exposure)  . COPD  Maternal Grandmother    Social History   Tobacco Use  . Smoking status: Never Smoker  . Smokeless tobacco: Never Used  Substance Use Topics  . Alcohol use: Yes    Comment: 1 drinks some weeks.  . Drug use: No   Current Outpatient Medications  Medication Sig Dispense Refill  . levocetirizine (XYZAL) 5 MG tablet Take 5 mg by mouth every evening.    . methylcellulose (CITRUCEL) oral powder Take 1 packet by mouth daily.    . Sodium Sulfate-Mag Sulfate-KCl (SUTAB) [phone] MG TABS Take 1 kit by mouth once for 1 dose. Lot: [id number], Exp: 03-22 24 tablet 0   No current facility-administered medications for this visit.   No Known Allergies   Review of Systems: All systems reviewed and negative except where noted in HPI.    Lab Results  Component Value Date   WBC 7.4 09/27/2018   HGB 15.9 09/27/2018   HCT 47.4 09/27/2018   MCV 95 09/27/2018   PLT 292 09/27/2018    No results found for: CREATININE, BUN, NA, K, CL, CO2  No results found for: ALT, AST, GGT, ALKPHOS, BILITOT   Physical Exam: BP 132/80   Pulse 88   Temp 97.9 F (36.6 C)  Ht '5\' 9"'  (1.753 m)   Wt 210 lb 4 oz (95.4 kg)   BMI 31.05 kg/m  Constitutional: Pleasant,well-developed, male in no acute distress. HEENT: Normocephalic and atraumatic. Conjunctivae are normal. No scleral icterus. Neck supple.  Cardiovascular: Normal rate, regular rhythm.  Pulmonary/chest: Effort normal and breath sounds normal. No wheezing, rales or rhonchi. Abdominal: Soft, nondistended, nontender.  There are no masses palpable.  Dreferred DRE until time of colonoscopy Extremities: no edema Lymphadenopathy: No cervical adenopathy noted. Neurological: Alert and oriented to person place and time. Skin: Skin is warm and dry. No rashes noted. Psychiatric: Normal mood and affect. Behavior is normal.   ASSESSMENT AND PLAN: 39 year old male here for new patient assessment of the following:  Rectal bleeding / altered bowel habits -  discussed differential diagnosis with him.  Hopefully he is just having a functional bowel change perhaps with hemorrhoidal bleeding, however colonoscopy is recommended to clarify source of bleeding, rule out polyp/mass lesion, rule out colitis/Crohn's disease.  I discussed risks and benefits of colonoscopy with him and he wanted to proceed.  Further recommendations pending results of this exam.  In interim I recommend that he use Citrucel every day to see if this will provide some regularity to his bowel in the interim.  DRE was deferred today in light of colonoscopy to be done in the very near future.   [place] Cellar, MD [facility]  CC: Rita Ohara, MD

## 2019-03-29 ENCOUNTER — Other Ambulatory Visit: Payer: Self-pay | Admitting: Gastroenterology

## 2019-03-29 ENCOUNTER — Encounter: Payer: Self-pay | Admitting: Gastroenterology

## 2019-03-29 ENCOUNTER — Ambulatory Visit (INDEPENDENT_AMBULATORY_CARE_PROVIDER_SITE_OTHER): Payer: BC Managed Care – PPO

## 2019-03-29 DIAGNOSIS — Z1159 Encounter for screening for other viral diseases: Secondary | ICD-10-CM

## 2019-03-30 LAB — SARS CORONAVIRUS 2 (TAT 6-24 HRS): SARS Coronavirus 2: NEGATIVE

## 2019-03-31 ENCOUNTER — Encounter: Payer: Self-pay | Admitting: Gastroenterology

## 2019-03-31 ENCOUNTER — Other Ambulatory Visit: Payer: Self-pay

## 2019-03-31 ENCOUNTER — Ambulatory Visit (AMBULATORY_SURGERY_CENTER): Payer: BC Managed Care – PPO | Admitting: Gastroenterology

## 2019-03-31 VITALS — BP 121/75 | HR 73 | Temp 97.3°F | Resp 17 | Ht 69.0 in | Wt 210.0 lb

## 2019-03-31 DIAGNOSIS — K602 Anal fissure, unspecified: Secondary | ICD-10-CM

## 2019-03-31 DIAGNOSIS — K648 Other hemorrhoids: Secondary | ICD-10-CM | POA: Diagnosis not present

## 2019-03-31 DIAGNOSIS — K6289 Other specified diseases of anus and rectum: Secondary | ICD-10-CM

## 2019-03-31 DIAGNOSIS — K6389 Other specified diseases of intestine: Secondary | ICD-10-CM

## 2019-03-31 DIAGNOSIS — K625 Hemorrhage of anus and rectum: Secondary | ICD-10-CM

## 2019-03-31 MED ORDER — AMBULATORY NON FORMULARY MEDICATION: 1 refills | Status: DC

## 2019-03-31 MED ORDER — SODIUM CHLORIDE 0.9 % IV SOLN: 500.0000 mL | Freq: Once | INTRAVENOUS | Status: DC

## 2019-03-31 NOTE — Patient Instructions (Signed)
YOU HAD AN ENDOSCOPIC PROCEDURE TODAY AT THE [facility]:   Refer to the procedure report that was given to you for any specific questions about what was found during the examination.  If the procedure report does not answer your questions, please call your gastroenterologist to clarify.  If you requested that your care partner not be given the details of your procedure findings, then the procedure report has been included in a sealed envelope for you to review at your convenience later.  YOU SHOULD EXPECT: Some feelings of bloating in the abdomen. Passage of more gas than usual.  Walking can help get rid of the air that was put into your GI tract during the procedure and reduce the bloating. If you had a lower endoscopy (such as a colonoscopy or flexible sigmoidoscopy) you may notice spotting of blood in your stool or on the toilet paper. If you underwent a bowel prep for your procedure, you may not have a normal bowel movement for a few days.  Please Note:  You might notice some irritation and congestion in your nose or some drainage.  This is from the oxygen used during your procedure.  There is no need for concern and it should clear up in a day or so.  SYMPTOMS TO REPORT IMMEDIATELY:   Following lower endoscopy (colonoscopy or flexible sigmoidoscopy):  Excessive amounts of blood in the stool  Significant tenderness or worsening of abdominal pains  Swelling of the abdomen that is new, acute  Fever of 100F or higher  For urgent or emergent issues, a gastroenterologist can be reached at any hour by calling [phone].   DIET:  We do recommend a small meal at first, but then you may proceed to your regular diet.  Drink plenty of fluids but you should avoid alcoholic beverages for 24 hours.  ACTIVITY:  You should plan to take it easy for the rest of today and you should NOT DRIVE or use heavy machinery until tomorrow (because of the sedation medicines used during the test).     FOLLOW UP: Our staff will call the number listed on your records 48-72 hours following your procedure to check on you and address any questions or concerns that you may have regarding the information given to you following your procedure. If we do not reach you, we will leave a message.  We will attempt to reach you two times.  During this call, we will ask if you have developed any symptoms of COVID 19. If you develop any symptoms (ie: fever, flu-like symptoms, shortness of breath, cough etc.) before then, please call [phone].  If you test positive for Covid 19 in the 2 weeks post procedure, please call and report this information to us.    If any biopsies were taken you will be contacted by phone or by letter within the next 1-3 weeks.  Please call us at [phone] if you have not heard about the biopsies in 3 weeks.    SIGNATURES/CONFIDENTIALITY: You and/or your care partner have signed paperwork which will be entered into your electronic medical record.  These signatures attest to the fact that that the information above on your After Visit Summary has been reviewed and is understood.  Full responsibility of the confidentiality of this discharge information lies with you and/or your care-partner. 

## 2019-03-31 NOTE — Progress Notes (Signed)
Report to PACU, RN, vss, BBS= Clear.  

## 2019-03-31 NOTE — Op Note (Signed)
[facility] Patient Name: Jason Casey Procedure Date: 03/31/2019 1:54 PM MRN: [id number] Endoscopist: Viviann Spare P. Adela Lank , MD Age: 39 Referring MD:  Date of Birth: [birth year] Gender: Male Account #: [id number] Procedure:                Colonoscopy Indications:              Rectal bleeding, Change in bowel habits Medicines:                Monitored Anesthesia Care Procedure:                Pre-Anesthesia Assessment:                           - Prior to the procedure, a History and Physical                            was performed, and patient medications and                            allergies were reviewed. The patient's tolerance of                            previous anesthesia was also reviewed. The risks                            and benefits of the procedure and the sedation                            options and risks were discussed with the patient.                            All questions were answered, and informed consent                            was obtained. Prior Anticoagulants: The patient has                            taken no previous anticoagulant or antiplatelet                            agents. ASA Grade Assessment: II - A patient with                            mild systemic disease. After reviewing the risks                            and benefits, the patient was deemed in                            satisfactory condition to undergo the procedure.                           After obtaining informed consent, the colonoscope  was passed under direct vision. Throughout the                            procedure, the patient's blood pressure, pulse, and                            oxygen saturations were monitored continuously. The                            Colonoscope was introduced through the anus and                            advanced to the the terminal ileum, with                            identification of the  appendiceal orifice and IC                            valve. The colonoscopy was performed without                            difficulty. The patient tolerated the procedure                            well. The quality of the bowel preparation was                            good. The terminal ileum, ileocecal valve,                            appendiceal orifice, and rectum were photographed. Scope In: 1:58:46 PM Scope Out: 2:18:06 PM Scope Withdrawal Time: 0 hours 16 minutes 14 seconds  Total Procedure Duration: 0 hours 19 minutes 20 seconds  Findings:                 An anal fissure was found on perianal exam,                            posterior midline anal canal.                           The terminal ileum appeared normal.                           Anal papilla(e) were hypertrophied. Biopsies were                            taken with a cold forceps for histology to rule out                            AIN.                           Internal hemorrhoids were found during retroflexion.  The exam was otherwise without abnormality. Complications:            No immediate complications. Estimated blood loss:                            Minimal. Estimated Blood Loss:     Estimated blood loss was minimal. Impression:               - Anal fissure found on perianal exam.                           - The examined portion of the ileum was normal.                           - Anal papilla(e) were hypertrophied. Biopsied.                           - Internal hemorrhoids.                           - The examination was otherwise normal.                           Suspect anal fissure is the cause of bleeding                            symptoms base on findings today. Recommendation:           - Patient has a contact number available for                            emergencies. The signs and symptoms of potential                            delayed complications were discussed  with the                            patient. Return to normal activities tomorrow.                            Written discharge instructions were provided to the                            patient.                           - Resume previous diet.                           - Continue present medications.                           - Await pathology results.                           - Take Citrucel once daily to normalize bowel  habits, avoid straining                           - Recommend topical 0.125% nitroglycerin ointment,                            pea sized amount applied PR three times daily for                            the next few weeks to heal anal fissure Viviann Spare P. Donovyn Guidice, MD 03/31/2019 2:23:00 PM This report has been signed electronically.

## 2019-04-04 ENCOUNTER — Telehealth: Payer: Self-pay

## 2019-04-04 NOTE — Telephone Encounter (Signed)
  Follow up Call-  Call back number 03/31/2019  Post procedure Call Back phone  # [phone]  Permission to leave phone message Yes  Some recent data might be hidden     Patient questions:  Do you have a fever, pain , or abdominal swelling? No. Pain Score  0 *  Have you tolerated food without any problems? Yes.    Have you been able to return to your normal activities? Yes.    Do you have any questions about your discharge instructions: Diet   No. Medications  No. Follow up visit  No.  Do you have questions or concerns about your Care? No.  Actions: * If pain score is 4 or above: No action needed, pain <4.  1. Have you developed a fever since your procedure? no  2.   Have you had an respiratory symptoms (SOB or cough) since your procedure? no  3.   Have you tested positive for COVID 19 since your procedure no  4.   Have you had any family members/close contacts diagnosed with the COVID 19 since your procedure?  no   If yes to any of these questions please route to Laverna Peace, RN and Jennye Boroughs, Charity fundraiser.

## 2019-04-04 NOTE — Telephone Encounter (Signed)
Attempted to reach pt. With follow-up call following endoscopic procedure 03/31/19.  LM on pt. Voice mail.  Will try to reach pt. Again later today. 

## 2019-04-15 ENCOUNTER — Ambulatory Visit: Payer: BC Managed Care – PPO | Attending: Internal Medicine

## 2019-04-15 DIAGNOSIS — Z20822 Contact with and (suspected) exposure to covid-19: Secondary | ICD-10-CM

## 2019-04-16 LAB — NOVEL CORONAVIRUS, NAA: SARS-CoV-2, NAA: NOT DETECTED

## 2019-05-04 ENCOUNTER — Telehealth: Payer: BC Managed Care – PPO | Admitting: Family

## 2019-05-04 DIAGNOSIS — B372 Candidiasis of skin and nail: Secondary | ICD-10-CM

## 2019-05-04 MED ORDER — NYSTATIN 100000 UNIT/GM EX POWD: 1.0000 "application " | Freq: Three times a day (TID) | CUTANEOUS | 0 refills | Status: DC

## 2019-05-04 MED ORDER — NYSTATIN 100000 UNIT/GM EX CREA: 1.0000 "application " | TOPICAL_CREAM | Freq: Two times a day (BID) | CUTANEOUS | 0 refills | Status: DC

## 2019-05-04 NOTE — Progress Notes (Signed)
E Visit for Rash  We are sorry that you are not feeling well. Here is how we plan to help!    Based upon your presentation it appears you have a fungal infection.  I have prescribed: and Nystatin cream apply to the affected area twice daily and nystatin powder.    HOME CARE:   Take cool showers and avoid direct sunlight.  Apply cool compress or wet dressings.  Take a bath in an oatmeal bath.  Sprinkle content of one Aveeno packet under running faucet with comfortably warm water.  Bathe for 15-20 minutes, 1-2 times daily.  Pat dry with a towel. Do not rub the rash.  Use hydrocortisone cream.  Take an antihistamine like Benadryl for widespread rashes that itch.  The adult dose of Benadryl is 25-50 mg by mouth 4 times daily.  Caution:  This type of medication may cause sleepiness.  Do not drink alcohol, drive, or operate dangerous machinery while taking antihistamines.  Do not take these medications if you have prostate enlargement.  Read package instructions thoroughly on all medications that you take.  GET HELP RIGHT AWAY IF:   Symptoms don't go away after treatment.  Severe itching that persists.  If you rash spreads or swells.  If you rash begins to smell.  If it blisters and opens or develops a yellow-brown crust.  You develop a fever.  You have a sore throat.  You become short of breath.  MAKE SURE YOU:  Understand these instructions. Will watch your condition. Will get help right away if you are not doing well or get worse.  Thank you for choosing an e-visit. Your e-visit answers were reviewed by a board certified advanced clinical practitioner to complete your personal care plan. Depending upon the condition, your plan could have included both over the counter or prescription medications. Please review your pharmacy choice. Be sure that the pharmacy you have chosen is open so that you can pick up your prescription now.  If there is a problem you may message  your provider in MyChart to have the prescription routed to another pharmacy. Your safety is important to [place]. If you have drug allergies check your prescription carefully.  For the next 24 hours, you can use MyChart to ask questions about today's visit, request a non-urgent call back, or ask for a work or school excuse from your e-visit provider. You will get an email in the next two days asking about your experience. I hope that your e-visit has been valuable and will speed your recovery.   Approximately 5 minutes was spent documenting and reviewing patient's chart.

## 2019-05-18 ENCOUNTER — Ambulatory Visit: Payer: BC Managed Care – PPO | Admitting: Family Medicine

## 2019-05-18 ENCOUNTER — Other Ambulatory Visit: Payer: Self-pay

## 2019-05-18 ENCOUNTER — Encounter: Payer: Self-pay | Admitting: Family Medicine

## 2019-05-18 VITALS — BP 128/84 | HR 80 | Temp 98.7°F | Ht 69.0 in | Wt 213.2 lb

## 2019-05-18 DIAGNOSIS — B354 Tinea corporis: Secondary | ICD-10-CM | POA: Diagnosis not present

## 2019-05-18 DIAGNOSIS — L309 Dermatitis, unspecified: Secondary | ICD-10-CM

## 2019-05-18 MED ORDER — TRIAMCINOLONE ACETONIDE 0.1 % EX CREA: 1.0000 "application " | TOPICAL_CREAM | Freq: Two times a day (BID) | CUTANEOUS | 0 refills | Status: DC

## 2019-05-18 NOTE — Progress Notes (Signed)
Chief Complaint  Patient presents with  . Rash    multiple places. Ears, armpits, lower back, buttocks, scrotum and behind right knee. Symptoms have been about a month. Did get a new body wash prior to the armpit rash and still using body wash.    He did an E-visit for the rash in L axilla earlier this month, and was started using nystatin cream and powder (didn't use today). He states that since using these meds, it is less raised, but more itchy, and still very red.  He also has some dry skin, h/o eczema, with some rashes on scrotum, buttock, and behind his R knee.  He also has h/o other fungal rashes.  Seen in UC 08/2018 treated with ketoconazole cream for jock area, didn't help, so stopped after 2 weeks.  He has been using Eucerin lotion to buttocks, didn't help, so used Desonex powder x 2 weeks (not today), without much benefit, sometimes itches more.  Noticed a spot on R axilla that looks like ringworm. Denies any change in deoderant. +new body wash.  PMH, PSH, SH reviewed  Outpatient Encounter Medications as of 05/18/2019  Medication Sig  . levocetirizine (XYZAL) 5 MG tablet Take 5 mg by mouth every evening.  . psyllium (METAMUCIL) 58.6 % powder Take 1 packet by mouth daily.  Marland Kitchen acetaminophen (TYLENOL) 325 MG tablet Take 650 mg by mouth every 6 (six) hours as needed.  . nystatin (MYCOSTATIN/NYSTOP) powder Apply 1 application topically 3 (three) times daily. (Patient not taking: Reported on 05/18/2019)  . nystatin cream (MYCOSTATIN) Apply 1 application topically 2 (two) times daily. (Patient not taking: Reported on 05/18/2019)  . [DISCONTINUED] AMBULATORY NON FORMULARY MEDICATION Medication Name: Nitroglycerin 0.125% gel. Apply pea size amount to the rectum three times a day for 6-8 weeks (Patient not taking: Reported on 05/18/2019)  . [DISCONTINUED] methylcellulose (CITRUCEL) oral powder Take 1 packet by mouth daily. (Patient not taking: Reported on 05/18/2019)   No facility-administered  encounter medications on file as of 05/18/2019.   No Known Allergies  ROS: no fever, chills, body aches, GI complaints (bowels normal since taking metamucil daily). Rashes per HPI.  No other complaints.   PHYSICAL EXAM:  BP 128/84   Pulse 80   Temp 98.7 F (37.1 C) (Other (Comment))   Ht 5\' 9"  (1.753 m)   Wt 213 lb 3.2 oz (96.7 kg)   BMI 31.48 kg/m   Well-appearing male, in no distress HEENT: conjunctiva and sclera are clear, EOMI. Wearing mask. Behind ears--dry patches, small healing fissure noted on the right, without any crusting or swelling. Patch of dry skin behind the R knee, no true rash visible, but slightly raised/dry. Scrotal skin appeared normal (he reports pruritis) L axilla--bright red area in central portion of axilla. R axilla--2 circular areas, one with some central clearing, and a third much smaller area of erythema starting inferiorly. Appears c/w ringworm Buttocks--above the gluteal fold and spreading to either side of the buttocks was some dry and excoriated skin.  No weeping, crusting, nontender. Neuro: alert and oriented, normal strength, gait Psych: normal mood, affect, hygiene and grooming   ASSESSMENT/PLAN:  Eczema, unspecified type - behind ears, knees, buttock. Discussed moisturizing, and use of TAC prn (and advised NOT to use on scrotum); also discussed OTC HC cream for milder areas - Plan: triamcinolone cream (KENALOG) 0.1 %  Tinea corporis - R axilla. To use OTC lamisil or lotrimin x 2 weeks   Eczema behind ears, knee and buttock. No visible abnormality  in scrotum--to avoid use of TAC to scrotal skin.  Axillae--suggests fungal. Change to lamisil or lotrimin in place of nystatin.   Moisturize your skin at least 2-3 times daily with a good CREAM (rather than lotion)--cerevie, cetaphil, eucerin. The ointment is probably best for behind the ears (and you can use plain vaseline).  Use Lotrim twice daily or Lamisil (once or twice daily) regularly  for 2 weeks minimum  TAC 0.1% cream Use the steroid cream sparingly to the areas of eczema, up to twice daily, for no longer than 10 days. You do not want to use this on normal looking skin, just the raised/rash areas. If you have itchy areas of skin (without rash), use an over-the-counter hydrocortisone cream.

## 2019-05-18 NOTE — Patient Instructions (Addendum)
  Moisturize your skin at least 2-3 times daily with a good CREAM (rather than lotion)--cerevie, cetaphil, eucerin. The ointment is probably best for behind the ears (and you can use plain vaseline).  Use Lotrim twice daily or Lamisil (once or twice daily) regularly for 2 weeks minimum (to your armpits).  TAC 0.1% cream is the steroid cream for the eczema (buttocks, behind ears, other dry rashes).  Use the steroid cream sparingly to the areas of eczema, up to twice daily, for no longer than 10 days. You do not want to use this on normal looking skin, just the raised/rash areas. If you have itchy areas of skin (without rash), use an over-the-counter hydrocortisone cream.

## 2019-06-07 ENCOUNTER — Encounter: Payer: Self-pay | Admitting: Family Medicine

## 2019-09-15 ENCOUNTER — Other Ambulatory Visit: Payer: Self-pay | Admitting: Family Medicine

## 2019-09-15 DIAGNOSIS — L309 Dermatitis, unspecified: Secondary | ICD-10-CM

## 2019-09-16 NOTE — Telephone Encounter (Signed)
LVM for pt to call back and advise if he need cream filled before his appt next week. KH

## 2019-09-19 NOTE — Telephone Encounter (Signed)
Left message again.

## 2019-09-21 NOTE — Telephone Encounter (Signed)
Third attempt to call pt to find out if he needs this med before his appt on the 29 th of this month. No answer and lvm. KH

## 2019-09-27 NOTE — Patient Instructions (Addendum)
HEALTH MAINTENANCE RECOMMENDATIONS:  It is recommended that you get at least 30 minutes of aerobic exercise at least 5 days/week (for weight loss, you may need as much as 60-90 minutes). This can be any activity that gets your heart rate up. This can be divided in 10-15 minute intervals if needed, but try and build up your endurance at least once a week.  Weight bearing exercise is also recommended twice weekly.  Eat a healthy diet with lots of vegetables, fruits and fiber.  "Colorful" foods have a lot of vitamins (ie green vegetables, tomatoes, red peppers, etc).  Limit sweet tea, regular sodas and alcoholic beverages, all of which has a lot of calories and sugar.  Up to 2 alcoholic drinks daily may be beneficial for men (unless trying to lose weight, watch sugars).  Drink a lot of water.  Sunscreen of at least SPF 30 should be used on all sun-exposed parts of the skin when outside between the hours of 10 am and 4 pm (not just when at beach or pool, but even with exercise, golf, tennis, and yard work!)  Use a sunscreen that says "broad spectrum" so it covers both UVA and UVB rays, and make sure to reapply every 1-2 hours.  Remember to change the batteries in your smoke detectors when changing your clock times in the spring and fall. Carbon monoxide detectors are recommended for your home.  Use your seat belt every time you are in a car, and please drive safely and not be distracted with cell phones and texting while driving.  Return for fasting cholesterol panel. Moisturize your legs well. Keep up the good work with diet and exercise. Congrats on the new job!  The bowel changes and sleep are likely related to stress.   Mindfulness-Based Stress Reduction Mindfulness-based stress reduction (MBSR) is a program that helps people learn to practice mindfulness. Mindfulness is the practice of intentionally paying attention to the present moment. It can be learned and practiced through techniques  such as education, breathing exercises, meditation, and yoga. MBSR includes several mindfulness techniques in one program. MBSR works best when you understand the treatment, are willing to try new things, and can commit to spending time practicing what you learn. MBSR training may include learning about:  How your emotions, thoughts, and reactions affect your body.  New ways to respond to things that cause negative thoughts to start (triggers).  How to notice your thoughts and let go of them.  Practicing awareness of everyday things that you normally do without thinking.  The techniques and goals of different types of meditation. What are the benefits of MBSR? MBSR can have many benefits, which include helping you to:  Develop self-awareness. This refers to knowing and understanding yourself.  Learn skills and attitudes that help you to participate in your own health care.  Learn new ways to care for yourself.  Be more accepting about how things are, and let things go.  Be less judgmental and approach things with an open mind.  Be patient with yourself and trust yourself more. MBSR has also been shown to:  Reduce negative emotions, such as depression and anxiety.  Improve memory and focus.  Change how you sense and approach pain.  Boost your body's ability to fight infections.  Help you connect better with other people.  Improve your sense of well-being. Follow these instructions at home:   Find a local in-person or online MBSR program.  Set aside some time regularly for mindfulness  practice.  Find a mindfulness practice that works best for you. This may include one or more of the following: ? Meditation. Meditation involves focusing your mind on a certain thought or activity. ? Breathing awareness exercises. These help you to stay present by focusing on your breath. ? Body scan. For this practice, you lie down and pay attention to each part of your body from head to  toe. You can identify tension and soreness and intentionally relax parts of your body. ? Yoga. Yoga involves stretching and breathing, and it can improve your ability to move and be flexible. It can also provide an experience of testing your body's limits, which can help you release stress. ? Mindful eating. This way of eating involves focusing on the taste, texture, color, and smell of each bite of food. Because this slows down eating and helps you feel full sooner, it can be an important part of a weight-loss plan.  Find a podcast or recording that provides guidance for breathing awareness, body scan, or meditation exercises. You can listen to these any time when you have a free moment to rest without distractions.  Follow your treatment plan as told by your health care provider. This may include taking regular medicines and making changes to your diet or lifestyle as recommended. How to practice mindfulness To do a basic awareness exercise:  Find a comfortable place to sit.  Pay attention to the present moment. Observe your thoughts, feelings, and surroundings just as they are.  Avoid placing judgment on yourself, your feelings, or your surroundings. Make note of any judgment that comes up, and let it go.  Your mind may wander, and that is okay. Make note of when your thoughts drift, and return your attention to the present moment. To do basic mindfulness meditation:  Find a comfortable place to sit. This may include a stable chair or a firm floor cushion. ? Sit upright with your back straight. Let your arms fall next to your side with your hands resting on your legs. ? If sitting in a chair, rest your feet flat on the floor. ? If sitting on a cushion, cross your legs in front of you.  Keep your head in a neutral position with your chin dropped slightly. Relax your jaw and rest the tip of your tongue on the roof of your mouth. Drop your gaze to the floor. You can close your eyes if you  like.  Breathe normally and pay attention to your breath. Feel the air moving in and out of your nose. Feel your belly expanding and relaxing with each breath.  Your mind may wander, and that is okay. Make note of when your thoughts drift, and return your attention to your breath.  Avoid placing judgment on yourself, your feelings, or your surroundings. Make note of any judgment or feelings that come up, let them go, and bring your attention back to your breath.  When you are ready, lift your gaze or open your eyes. Pay attention to how your body feels after the meditation. Where to find more information You can find more information about MBSR from:  Your health care provider.  Community-based meditation centers or programs.  Programs offered near you. Summary  Mindfulness-based stress reduction (MBSR) is a program that teaches you how to intentionally pay attention to the present moment. It is used with other treatments to help you cope better with daily stress, emotions, and pain.  MBSR focuses on developing self-awareness, which allows you  to respond to life stress without judgment or negative emotions.  MBSR programs may involve learning different mindfulness practices, such as breathing exercises, meditation, yoga, body scan, or mindful eating. Find a mindfulness practice that works best for you, and set aside time for it on a regular basis. This information is not intended to replace advice given to you by your health care provider. Make sure you discuss any questions you have with your health care provider. Document Revised: 01/30/2017 Document Reviewed: 06/26/2016 Elsevier Patient Education  2020 [employer].

## 2019-09-27 NOTE — Progress Notes (Signed)
Chief Complaint  Patient presents with  . Annual Exam    nonfasting annual exam. He thinks he has ezcema on his leg and in between his fingers-would like a steroid cream if possible. Also thinks he may IBS and would  like to discuss.     Jason Casey is a 39 y.o. male who presents for a complete physical.  He has the following concerns:  He was seen in 05/2019 with various rashes. He was diagnosed with eczema behind his ears, knees and buttock and was prescribed TAC 0.1% cream. His rash in R axilla was felt to be fungal--switched from nystatin to OTC lamisil or lotrimin for 2 week course. He sent a message in April that the axilla and ears were much better.  Was having issues on his scalp that weren't responding to selsun blue.  He has used the hydrocortisone lotion to his scalp, which helps. He is mainly having itching at the posterior scalp.  After scratching, sometimes notices flakes (otherwise not aware of significant dandruff). Head and Shoulders shampoo worked well for the flaking at his eyebrows/face. Today he is reporting a rash on his right shin. It is sometimes flaky, sometimes itchy.  Seems to get very red/hot sometimes, other times is dry and flaky. Denies drainage, pain, swelling. He tries to keep it well moisturized. He uses a lotion with 1% hydrocortisone which helps.  Tries to use it once daily. He is out of the TAC cream.  Used it some on the leg, couldn't tell if it helped.  Worse later in the day, unsure if worse related to heat, wearing dress socks that cover the area.  He has had bowel issues (constipation, loose stools), presumed IBS.  He saw Dr. Havery Moros and had colonoscopy in 03/2019. He was noted to have anal fissure (as the likely cause of the rectal bleeding he had presented to him with), and was recommended to take a daily fiber supplement along with using 0.125% topical nitroglycerin ointment applied to the area a few times per day for a few weeks. He also had internal  hemorrhoids and hypertrophied papilla that was biopsied and benign. The nitroglycerin gave him headaches. Fissure resolved.  He hasn't had any further significant rectal bleeding.  Stools had gone back to normal--daily, formed, though recently he noted his bowels again becoming more loose, floating, break apart more easily.  He has been under a lot of stress related to job search. He does report that since he got an offer, he had two normal bowel movements.  Last year we discussed the elevated TG found on insurance labs (reports he was fasting 6 hours, see below).  We discussed lowfat diet, the possibility of fish oil.  Due for recheck. He is not fasting today.  He had gained weight over the pandemic, but is eating better and exercising, and has been losing it again (states weight 5/31 was 212.4#, and was 199.6# at home this morning.) He is eating a healthy diet--minimal complex carbs, rare fried foods. Cut back drastically on soda intake, no longer daily.  Immunization History  Administered Date(s) Administered  . DTaP 11/12/1980, 02/14/1981, 04/30/1981, 06/20/1982  . IPV 11/12/1980, 02/14/1981, 04/30/1981, 06/20/1982  . Influenza Inj Mdck Quad Pf 11/30/2018  . Influenza,inj,Quad PF,6+ Mos 12/03/2017, 12/02/2018  . Influenza-Unspecified 12/03/2017  . MMR 01/04/1982, 09/21/2017  . PFIZER SARS-COV-2 Vaccination 05/06/2019, 05/27/2019  . PPD Test 09/21/2017  . Tdap 09/21/2017   Last colonoscopy: 03/2019 Dr. Havery Moros. Noted to have anal fissure, internal hemorrhoids,  hypertrophied anal papilla(e) Ophtho: never Dentist:  Twice yearly Exercise:  an hour daily--3 days/week weight training. Yoga once a week. Some martial arts, plyometrics, walking and stretching.  Occasional jogging.  Labs for insurance (brought in last year)-- 08/30/2018: Had fasted 6 hours Chol 202, HDL 42.7, LDL 118, TG 205, ratio 4.73 A1c 5% Cr 1.2 LFT's normal (no CBC done, no TSH)   PMH, PSH, SH and FH were reviewed  and updated  Outpatient Encounter Medications as of 09/29/2019  Medication Sig  . levocetirizine (XYZAL) 5 MG tablet Take 5 mg by mouth every evening.  Marland Kitchen acetaminophen (TYLENOL) 325 MG tablet Take 650 mg by mouth every 6 (six) hours as needed. (Patient not taking: Reported on 09/29/2019)  . triamcinolone cream (KENALOG) 0.1 % Apply 1 application topically 2 (two) times daily. (Patient not taking: Reported on 09/29/2019)  . [DISCONTINUED] nystatin (MYCOSTATIN/NYSTOP) powder Apply 1 application topically 3 (three) times daily. (Patient not taking: Reported on 05/18/2019)  . [DISCONTINUED] nystatin cream (MYCOSTATIN) Apply 1 application topically 2 (two) times daily. (Patient not taking: Reported on 05/18/2019)  . [DISCONTINUED] psyllium (METAMUCIL) 58.6 % powder Take 1 packet by mouth daily.   No facility-administered encounter medications on file as of 09/29/2019.   Takes Citricel (fiber supplement) daily  No Known Allergies  ROS: The patient denies anorexia, fever,vision loss, decreased hearing, ear pain, hoarseness, palpitations, dizziness, syncope, dyspnea on exertion, cough, swelling, nausea, vomiting, abdominal pain, melena, hematochezia, indigestion/heartburn, hematuria, incontinence, erectile dysfunction, nocturia, weakened urine stream, dysuria, genital lesions, joint pains, weakness, tremor, suspicious skin lesions, depression, anxiety, abnormal bleeding/bruising, or enlarged lymph nodes. He has had a slight sore throat for a couple of weeks, PND. No runny nose, sinus pain, fever, shortness of breath. Improving. Numbness intermittently in feet and arms/hand, positional. L arm occasionally numb when working out, not with exertion R chest wall/nipple pain, last week after pushups.  Sometimes cracking shoulder blade helps the discomfort resolve. Denies exertional chest pain. Itching at posterior scalp, rash at RLE (see HPI), sometimes scrotum Some trouble falling and staying asleep for the   last few weeks. +stress   PHYSICAL EXAM:  BP 118/68   Pulse 72   Ht _0  (1.753 m)   Wt (!) 203 lb 6.4 oz (92.3 kg)   BMI 30.04 kg/m    Wt Readings from Last 3 Encounters:  09/29/19 (!) 203 lb 6.4 oz (92.3 kg)  05/18/19 213 lb 3.2 oz (96.7 kg)  03/31/19 210 lb (95.3 kg)    General Appearance:  Alert, cooperative, no distress, appears stated age  Head:  Normocephalic, without obvious abnormality, atraumatic  Eyes:  PERRL, conjunctiva/corneas clear, EOM's intact, fundi  benign  Ears:  Normal TM's and external ear canals  Nose: Nares are mild-mod edematous with clear-white mucus noted R>L. Sinuses nontender  Throat: OP is clear, no erythema, exudates or any mucosal abnormalities.  Moist mucus membranes  Neck: Supple, no lymphadenopathy; thyroid: no enlargement/ tenderness/nodules; no carotidbruit or JVD  Back:  Spine nontender, no curvature, ROM normal, no CVA tenderness.   Lungs:  Clear to auscultation bilaterally without wheezes, rales or ronchi; respirations unlabored  Chest Wall:  No tenderness or deformity.   Heart:  Regular rate and rhythm, S1 and S2 normal, no murmur, rub or gallop  Breast Exam:  No chest wall tenderness, masses or gynecomastia.   Abdomen:  Soft, non-tender, nondistended, normoactive bowel sounds,  no masses, no hepatosplenomegaly  Genitalia:  Normal male external genitalia without lesions. Testicles without masses.  No inguinal hernias present.  Rectal:  Deferred (age <40, no symptoms, colonoscopy earlier this year)  Extremities: No clubbing, cyanosis or edema.  Pulses: 2+ and symmetric all extremities  Skin: Skin color, texture, turgor normal, no lesions. Scalp appears normal--no flaking, erythema or other abnormality noted. Skin is clear behind his ears.  He has some slight erythematous changes to his RLE (normal on the left), no warmth or swelling. There is a slightly flaky patch at  The R mid-shin  (which is also slightly pink), blanches easily. Nontender, no weeping/crusting.  Some mild superficial veins in the area, no varicosities noted.  Lymph nodes: Cervical, supraclavicular, and axillary nodes normal  Neurologic: CNII-XII intact, normal strength, sensation and gait; reflexes 2+ and symmetric throughout  Psych: Normal mood, affect, hygiene and grooming   ASSESSMENT/PLAN:  Annual physical exam - Plan: Visual acuity screening, Lipid panel  Hypertriglyceridemia - reviewed proper diet. Will return fasting for lipids - Plan: Lipid panel  Eczema, unspecified type - Resolved most places.  Small spot R shin. R/F TAC. discussed sparing use of steroids, keep well moisturized (without HC).  - Plan: triamcinolone cream (KENALOG) 0.1 %  Itchy scalp - posteriorly only, intermittent. Reassured normal exam (no sebderm, eczema, lice), ?related to stress  Irritable bowel syndrome, unspecified type - questions answered, no diagnostic test. Reassured about normal colonoscopy. Suspect IBS with loose bowels with stress. Cont fiber supp    Recommended at least 30 minutes of aerobic activity at least 5 days/week, weight-bearing exercise at least 2x/week; proper sunscreen use reviewed; healthy diet and alcohol recommendations (less than or equal to 2 drinks/day) reviewed; regular seatbelt use; changing batteries in smoke detectors, having carbon monoxide detectors. Self-testicular exams. Immunization recommendations discussed, yearly flu shots recommended.   Significant time spent counseling re: bowels, skin issues, stress, various pain complaints (mainly chest), diet, weight. Over 60 FTF minutes with this patient, plus additional time spent in chart review and documentation.  F/u 1 year, sooner prn

## 2019-09-29 ENCOUNTER — Encounter: Payer: Self-pay | Admitting: Family Medicine

## 2019-09-29 ENCOUNTER — Other Ambulatory Visit: Payer: Self-pay

## 2019-09-29 ENCOUNTER — Ambulatory Visit: Payer: BC Managed Care – PPO | Admitting: Family Medicine

## 2019-09-29 VITALS — BP 118/68 | HR 72 | Ht 69.0 in | Wt 203.4 lb

## 2019-09-29 DIAGNOSIS — L299 Pruritus, unspecified: Secondary | ICD-10-CM

## 2019-09-29 DIAGNOSIS — Z0000 Encounter for general adult medical examination without abnormal findings: Secondary | ICD-10-CM

## 2019-09-29 DIAGNOSIS — E781 Pure hyperglyceridemia: Secondary | ICD-10-CM | POA: Diagnosis not present

## 2019-09-29 DIAGNOSIS — K589 Irritable bowel syndrome without diarrhea: Secondary | ICD-10-CM

## 2019-09-29 DIAGNOSIS — L309 Dermatitis, unspecified: Secondary | ICD-10-CM

## 2019-09-29 MED ORDER — TRIAMCINOLONE ACETONIDE 0.1 % EX CREA: 1.0000 "application " | TOPICAL_CREAM | Freq: Two times a day (BID) | CUTANEOUS | 0 refills | Status: AC

## 2019-10-02 DIAGNOSIS — R7612 Nonspecific reaction to cell mediated immunity measurement of gamma interferon antigen response without active tuberculosis: Secondary | ICD-10-CM

## 2019-10-02 HISTORY — DX: Nonspecific reaction to cell mediated immunity measurement of gamma interferon antigen response without active tuberculosis: R76.12

## 2019-10-10 ENCOUNTER — Encounter: Payer: Self-pay | Admitting: Family Medicine

## 2019-10-14 ENCOUNTER — Other Ambulatory Visit: Payer: BC Managed Care – PPO

## 2019-10-14 ENCOUNTER — Other Ambulatory Visit: Payer: Self-pay

## 2019-10-14 DIAGNOSIS — E781 Pure hyperglyceridemia: Secondary | ICD-10-CM

## 2019-10-14 DIAGNOSIS — Z111 Encounter for screening for respiratory tuberculosis: Secondary | ICD-10-CM

## 2019-10-14 DIAGNOSIS — Z0000 Encounter for general adult medical examination without abnormal findings: Secondary | ICD-10-CM

## 2019-10-14 LAB — LIPID PANEL
Chol/HDL Ratio: 4.3 ratio (ref 0.0–5.0)
Cholesterol, Total: 170 mg/dL (ref 100–199)
HDL: 40 mg/dL (ref >39)
LDL Chol Calc (NIH): 112 mg/dL — ABNORMAL HIGH (ref 0–99)
Triglycerides: 99 mg/dL (ref 0–149)
VLDL Cholesterol Cal: 18 mg/dL (ref 5–40)

## 2019-10-15 ENCOUNTER — Encounter: Payer: Self-pay | Admitting: Family Medicine

## 2019-10-18 ENCOUNTER — Ambulatory Visit
Admission: RE | Admit: 2019-10-18 | Discharge: 2019-10-18 | Disposition: A | Discharge status: Home / Self Care | Payer: BC Managed Care – PPO | Source: Ambulatory Visit | Attending: Family Medicine | Admitting: Family Medicine

## 2019-10-18 ENCOUNTER — Other Ambulatory Visit: Payer: Self-pay | Admitting: Family Medicine

## 2019-10-18 ENCOUNTER — Other Ambulatory Visit: Payer: Self-pay

## 2019-10-18 DIAGNOSIS — R7612 Nonspecific reaction to cell mediated immunity measurement of gamma interferon antigen response without active tuberculosis: Secondary | ICD-10-CM

## 2019-10-18 LAB — QUANTIFERON-TB GOLD PLUS
QuantiFERON Mitogen Value: >10 IU/mL
QuantiFERON Nil Value: 0.02 IU/mL
QuantiFERON TB1 Ag Value: 0.46 IU/mL
QuantiFERON TB2 Ag Value: 0.25 IU/mL
QuantiFERON-TB Gold Plus: POSITIVE — AB

## 2019-10-20 ENCOUNTER — Encounter: Payer: Self-pay | Admitting: Family Medicine

## 2019-11-28 ENCOUNTER — Encounter: Payer: Self-pay | Admitting: Family Medicine

## 2019-11-30 NOTE — Patient Instructions (Signed)
Tuberculosis Tuberculosis (TB) is an infection that usually affects the lungs but can affect other parts of the body. It is caused by bacteria. There are two forms of TB:  Active TB, also called TB disease. This means that you have TB symptoms and your infection can spread to another person (you are contagious).  Latent TB. This means that you do not have any symptoms of TB and you are not contagious. Latent TB can turn into active TB, so it is important to get treatment no matter which form of TB you have. What are the causes? TB is caused by bacteria called Mycobacterium tuberculosis. You can catch the bacteria by breathing in droplets from a cough or sneeze from someone who has active TB. What increases the risk? You are more likely to develop TB if you:  Have HIV (human immunodeficiency virus) or AIDS (acquired immunodeficiency syndrome).  Have diabetes (diabetes mellitus).  Are older. Infants are also more likely to get TB.  Use illegal drugs.  Use tobacco.  Live in or travel to areas that have high rates of TB, such as: ? [place]. ? [place]. ? [place]. ? [place]. ? [place]. ? [place]. ? [place].  Live or work in areas of overcrowding or poor airflow (ventilation), including: ? Health care facilities. ? Residential care facilities, such as an assisted living facility. ? Refugee camps or shelters for people who are experiencing homelessness. What are the signs or symptoms? Symptoms of this condition include:  Coughing up blood, mucus from the lungs (sputum), or both.  A cough that lasts three weeks or longer.  Chest pain, or pain while breathing or coughing.  Unexplained weight loss.  Fatigue and weakness.  Fever.  Sweating.  Chills.  Loss of appetite. How is this diagnosed? This condition may be diagnosed based on a physical exam, your symptoms, and your medical history. You may have chest X-rays done. You may also have one or more of the following  samples taken and tested for bacteria:  Skin.  Blood.  Sputum.  Urine. How is this treated? Both latent and active TB are treated with antibiotic medicine. You may need to take antibiotics for up to 6-9 months. Follow these instructions at home:     Medicines  Take over-the-counter and prescription medicines only as told by your health care provider.  Take your antibiotic medicine as told by your health care provider. Do not stop taking the antibiotic even if you start to feel better. Activity  Rest as needed. Ask your health care provider what activities are safe for you.  Do not go back to work or school until your health care provider approves.  Avoid close contact with others (especially infants and older people) until your health care provider says that you are no longer contagious. General instructions  Tell your health care provider about all the people you live with or have close contact with. Those people may need to be tested for TB.  Do not use any products that contain nicotine or tobacco, such as cigarettes and e-cigarettes. If you need help quitting, ask your health care provider.  Cover your mouth and nose when you cough or sneeze. Dispose of used tissues as directed by your health care provider.  Wash your hands often with soap and water. If soap and water are not available, use hand sanitizer.  Keep all follow-up visits as told by your health care provider. This is important. Contact a health care provider if:  You have   new symptoms.  You lose your appetite.  You feel nauseous or you vomit.  Your urine is dark yellow.  Your skin or the white part of your eyes turns a yellowish color (jaundice).  Your symptoms get worse or do not go away with treatment.  You have a fever. Get help right away if:  You have chest pain.  You cough up blood.  You have trouble breathing or feel short of breath.  You have a headache or neck  stiffness. Summary  Tuberculosis (TB) is an infection that usually affects the lungs but can affect other parts of the body. It is caused by bacteria.  The two forms of TB are active TB and latent TB. If you have active TB, your infection can spread to another person (you are contagious).  Latent TB can turn into active TB, so it is important to get treatment no matter which form of TB you have.  TB is treated with antibiotic medicine. You may need to take antibiotics for up to 6-9 months. This information is not intended to replace advice given to you by your health care provider. Make sure you discuss any questions you have with your health care provider. Document Revised: 05/19/2017 Document Reviewed: 09/05/2016 Elsevier Patient Education  2020 [employer]  

## 2019-11-30 NOTE — Progress Notes (Signed)
Chief Complaint  Patient presents with  . Consult    latent TB treatment consultation and flu shot and covid booster.    Patient presents to discuss treatment for latent TB.  He had + quantiferon gold, negative chest x-ray.  He denies any cough, night sweats or other problems. He reports he had a negative PPD 2 years ago, before grad school.  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  He would also like to get his flu shot and COVID booster today  PMH, PSH, SH reviewed  Very rare alcohol intake.  Immunization History  Administered Date(s) Administered  . DTaP 11/12/1980, 02/14/1981, 04/30/1981, 06/20/1982  . IPV 11/12/1980, 02/14/1981, 04/30/1981, 06/20/1982  . Influenza Inj Mdck Quad Pf 11/30/2018  . Influenza,inj,Quad PF,6+ Mos 12/03/2017, 12/02/2018  . Influenza-Unspecified 12/03/2017  . MMR 01/04/1982, 09/21/2017  . PFIZER SARS-COV-2 Vaccination 05/06/2019, 05/27/2019  . PPD Test 09/21/2017  . Tdap 09/21/2017   Outpatient Encounter Medications as of 12/01/2019  Medication Sig  . levocetirizine (XYZAL) 5 MG tablet Take 5 mg by mouth every evening.  . methylcellulose (CITRUCEL) oral powder Take 1 packet by mouth daily.  Marland Kitchen acetaminophen (TYLENOL) 325 MG tablet Take 650 mg by mouth every 6 (six) hours as needed. (Patient not taking: Reported on 09/29/2019)  . isoniazid (NYDRAZID) 300 MG tablet Take 1 tablet (300 mg total) by mouth daily.  Marland Kitchen pyridOXINE (VITAMIN B-6) 25 MG tablet Take 1 tablet (25 mg total) by mouth daily.  Marland Kitchen triamcinolone cream (KENALOG) 0.1 % Apply 1 application topically 2 (two) times daily. (Patient not taking: Reported on 12/01/2019)   No facility-administered encounter medications on file as of 12/01/2019.   (NOT taking INH or pyridoxine prior to today's visit)  No Known Allergies  ROS: no fever, chills, night sweats, weight loss, URI symptoms, chest pain, shortness of breath, GI or GU  complaints. No rashes, pain or other complaints.  Moods are good.   PHYSICAL EXAM:  BP 118/68   Pulse 60   Ht '5\' 9"'  (1.753 m)   Wt 196 lb 12.8 oz (89.3 kg)   BMI 29.06 kg/m   Wt Readings from Last 3 Encounters:  12/01/19 196 lb 12.8 oz (89.3 kg)  09/29/19 (!) 203 lb 6.4 oz (92.3 kg)  05/18/19 213 lb 3.2 oz (96.7 kg)   Well appearing, pleasant male in no distress HEENT: conjunctiva and sclera are clear, EOMI. Wearing mask Neck: No lymphadenopathy or mass Heart: regular rate and rhythm Lungs: clear bilaterally, no wheezes, rales, ronchi Abdomen: soft, nontender, no organomegaly or mass Back: no spinal or CVA tenderness Extremities: no edema. Psych: normal mood, affect, hygiene and grooming  ASSESSMENT/PLAN:  TB lung, latent - reviewed 6 month monotherapy vs 3 month combo therapy regimens. Prefers 6 mos INH. Risks/SE reviewed, and need for B6, monthly LFTs - Plan: isoniazid (NYDRAZID) 300 MG tablet, pyridOXINE (VITAMIN B-6) 25 MG tablet  Positive QuantiFERON-TB Gold test  Medication monitoring encounter - Plan: Comprehensive metabolic panel  Need for influenza vaccination - Plan: Flu Vaccine QUAD 6+ mos PF IM (Fluarix Quad PF)  Need for viral immunization - Plan: Pfizer SARS-COV-2 Vaccine   Reviewed options for treating LTB  3 months daily INH + Rifampin (similar risks as 6 mos of INH alone)--INH 318m daily, rifampin 6060mdaily, both on empty stomach 3 mos of weekly INH + rifapentine (90076mk INH, empty stomach, 900m13mfapentin qwk, WITH food) 6-9 months of INH alone  Pyridoxine along  with INH  Prefers single drug   c-met at baseline, today LFT's q4wks Standing order entered

## 2019-12-01 ENCOUNTER — Other Ambulatory Visit: Payer: Self-pay

## 2019-12-01 ENCOUNTER — Encounter: Payer: Self-pay | Admitting: Family Medicine

## 2019-12-01 ENCOUNTER — Ambulatory Visit (INDEPENDENT_AMBULATORY_CARE_PROVIDER_SITE_OTHER): Payer: BC Managed Care – PPO | Admitting: Family Medicine

## 2019-12-01 VITALS — BP 118/68 | HR 60 | Ht 69.0 in | Wt 196.8 lb

## 2019-12-01 DIAGNOSIS — Z5181 Encounter for therapeutic drug level monitoring: Secondary | ICD-10-CM | POA: Diagnosis not present

## 2019-12-01 DIAGNOSIS — Z227 Latent tuberculosis: Secondary | ICD-10-CM

## 2019-12-01 DIAGNOSIS — Z23 Encounter for immunization: Secondary | ICD-10-CM | POA: Diagnosis not present

## 2019-12-01 DIAGNOSIS — R7612 Nonspecific reaction to cell mediated immunity measurement of gamma interferon antigen response without active tuberculosis: Secondary | ICD-10-CM | POA: Diagnosis not present

## 2019-12-01 MED ORDER — PYRIDOXINE HCL 25 MG PO TABS: 25.0000 mg | ORAL_TABLET | Freq: Every day | ORAL | 5 refills | Status: DC

## 2019-12-01 MED ORDER — ISONIAZID 300 MG PO TABS: 300.0000 mg | ORAL_TABLET | Freq: Every day | ORAL | 1 refills | Status: DC

## 2019-12-02 LAB — COMPREHENSIVE METABOLIC PANEL
ALT: 11 IU/L (ref 0–44)
AST: 16 IU/L (ref 0–40)
Albumin/Globulin Ratio: 2.1 (ref 1.2–2.2)
Albumin: 4.6 g/dL (ref 4.0–5.0)
Alkaline Phosphatase: 99 IU/L (ref 44–121)
BUN/Creatinine Ratio: 8 — ABNORMAL LOW (ref 9–20)
BUN: 9 mg/dL (ref 6–20)
Bilirubin Total: 0.6 mg/dL (ref 0.0–1.2)
CO2: 27 mmol/L (ref 20–29)
Calcium: 9.5 mg/dL (ref 8.7–10.2)
Chloride: 106 mmol/L (ref 96–106)
Creatinine, Ser: 1.11 mg/dL (ref 0.76–1.27)
GFR calc Af Amer: 96 mL/min/{1.73_m2} (ref >59)
GFR calc non Af Amer: 83 mL/min/{1.73_m2} (ref >59)
Globulin, Total: 2.2 g/dL (ref 1.5–4.5)
Glucose: 93 mg/dL (ref 65–99)
Potassium: 4.3 mmol/L (ref 3.5–5.2)
Sodium: 143 mmol/L (ref 134–144)
Total Protein: 6.8 g/dL (ref 6.0–8.5)

## 2019-12-23 ENCOUNTER — Other Ambulatory Visit: Payer: Self-pay | Admitting: Family Medicine

## 2019-12-23 DIAGNOSIS — Z227 Latent tuberculosis: Secondary | ICD-10-CM

## 2019-12-23 NOTE — Telephone Encounter (Signed)
Ok to refill 

## 2019-12-29 ENCOUNTER — Other Ambulatory Visit: Payer: Self-pay

## 2019-12-29 ENCOUNTER — Other Ambulatory Visit: Payer: BC Managed Care – PPO

## 2019-12-29 DIAGNOSIS — Z5181 Encounter for therapeutic drug level monitoring: Secondary | ICD-10-CM

## 2019-12-30 LAB — HEPATIC FUNCTION PANEL
ALT: 17 IU/L (ref 0–44)
AST: 20 IU/L (ref 0–40)
Albumin: 4.4 g/dL (ref 4.0–5.0)
Alkaline Phosphatase: 104 IU/L (ref 44–121)
Bilirubin Total: 0.6 mg/dL (ref 0.0–1.2)
Bilirubin, Direct: 0.16 mg/dL (ref 0.00–0.40)
Total Protein: 6.7 g/dL (ref 6.0–8.5)

## 2020-01-27 ENCOUNTER — Other Ambulatory Visit: Payer: Self-pay | Admitting: Family Medicine

## 2020-01-27 DIAGNOSIS — Z227 Latent tuberculosis: Secondary | ICD-10-CM

## 2020-01-30 NOTE — Telephone Encounter (Signed)
Pt called concerning refill. He only has one more dose and wanted to make sure it would be available by then

## 2020-01-30 NOTE — Telephone Encounter (Signed)
Advise pt--refill sent in. I see that he has lab visit scheduled for tomorrow.

## 2020-01-31 ENCOUNTER — Other Ambulatory Visit: Payer: BC Managed Care – PPO

## 2020-01-31 ENCOUNTER — Other Ambulatory Visit: Payer: Self-pay

## 2020-01-31 DIAGNOSIS — Z5181 Encounter for therapeutic drug level monitoring: Secondary | ICD-10-CM

## 2020-02-01 LAB — HEPATIC FUNCTION PANEL
ALT: 28 IU/L (ref 0–44)
AST: 24 IU/L (ref 0–40)
Albumin: 4.3 g/dL (ref 4.0–5.0)
Alkaline Phosphatase: 104 IU/L (ref 44–121)
Bilirubin Total: 0.4 mg/dL (ref 0.0–1.2)
Bilirubin, Direct: 0.13 mg/dL (ref 0.00–0.40)
Total Protein: 6.8 g/dL (ref 6.0–8.5)

## 2020-02-28 ENCOUNTER — Other Ambulatory Visit: Payer: BC Managed Care – PPO

## 2020-02-28 ENCOUNTER — Other Ambulatory Visit: Payer: Self-pay

## 2020-02-28 DIAGNOSIS — Z5181 Encounter for therapeutic drug level monitoring: Secondary | ICD-10-CM

## 2020-02-29 LAB — HEPATIC FUNCTION PANEL
ALT: 29 IU/L (ref 0–44)
AST: 25 IU/L (ref 0–40)
Albumin: 4.2 g/dL (ref 4.0–5.0)
Alkaline Phosphatase: 94 IU/L (ref 44–121)
Bilirubin Total: 0.6 mg/dL (ref 0.0–1.2)
Bilirubin, Direct: 0.17 mg/dL (ref 0.00–0.40)
Total Protein: 6.6 g/dL (ref 6.0–8.5)

## 2020-03-13 ENCOUNTER — Other Ambulatory Visit: Payer: Self-pay

## 2020-03-13 DIAGNOSIS — Z20822 Contact with and (suspected) exposure to covid-19: Secondary | ICD-10-CM

## 2020-03-15 LAB — NOVEL CORONAVIRUS, NAA: SARS-CoV-2, NAA: NOT DETECTED

## 2020-03-15 LAB — SARS-COV-2, NAA 2 DAY TAT

## 2020-03-27 ENCOUNTER — Other Ambulatory Visit: Payer: BC Managed Care – PPO

## 2020-03-27 DIAGNOSIS — Z5181 Encounter for therapeutic drug level monitoring: Secondary | ICD-10-CM

## 2020-03-28 ENCOUNTER — Other Ambulatory Visit: Payer: Self-pay | Admitting: Family Medicine

## 2020-03-28 ENCOUNTER — Encounter: Payer: Self-pay | Admitting: Family Medicine

## 2020-03-28 DIAGNOSIS — Z227 Latent tuberculosis: Secondary | ICD-10-CM

## 2020-03-28 LAB — HEPATIC FUNCTION PANEL
ALT: 23 IU/L (ref 0–44)
AST: 23 IU/L (ref 0–40)
Albumin: 4.3 g/dL (ref 4.0–5.0)
Alkaline Phosphatase: 110 IU/L (ref 44–121)
Bilirubin Total: 0.4 mg/dL (ref 0.0–1.2)
Bilirubin, Direct: 0.12 mg/dL (ref 0.00–0.40)
Total Protein: 6.7 g/dL (ref 6.0–8.5)

## 2020-03-28 NOTE — Telephone Encounter (Signed)
Is this okay to refill? 

## 2020-04-23 ENCOUNTER — Other Ambulatory Visit: Payer: Self-pay | Admitting: Family Medicine

## 2020-04-23 DIAGNOSIS — Z227 Latent tuberculosis: Secondary | ICD-10-CM

## 2020-04-23 NOTE — Telephone Encounter (Signed)
Just want to confirm that he does not need this refill, correct?

## 2020-04-23 NOTE — Telephone Encounter (Signed)
He was given refills on 1/26 for #30 with 1 refill, which should finish his course.  I think this is asking to change to 90d supply, which isn't appropriate.  He should have 1 30d refill left on file that he needs to finish

## 2020-04-24 ENCOUNTER — Other Ambulatory Visit: Payer: Self-pay

## 2020-04-24 ENCOUNTER — Other Ambulatory Visit: Payer: BC Managed Care – PPO

## 2020-04-24 DIAGNOSIS — Z5181 Encounter for therapeutic drug level monitoring: Secondary | ICD-10-CM

## 2020-04-25 ENCOUNTER — Encounter: Payer: Self-pay | Admitting: Family Medicine

## 2020-04-25 LAB — HEPATIC FUNCTION PANEL
ALT: 23 IU/L (ref 0–44)
AST: 26 IU/L (ref 0–40)
Albumin: 4.5 g/dL (ref 4.0–5.0)
Alkaline Phosphatase: 112 IU/L (ref 44–121)
Bilirubin Total: 0.4 mg/dL (ref 0.0–1.2)
Bilirubin, Direct: 0.12 mg/dL (ref 0.00–0.40)
Total Protein: 6.9 g/dL (ref 6.0–8.5)

## 2020-06-24 ENCOUNTER — Telehealth: Payer: BC Managed Care – PPO | Admitting: Family

## 2020-06-24 DIAGNOSIS — J209 Acute bronchitis, unspecified: Secondary | ICD-10-CM | POA: Diagnosis not present

## 2020-06-24 MED ORDER — BENZONATATE 100 MG PO CAPS: 100.0000 mg | ORAL_CAPSULE | Freq: Three times a day (TID) | ORAL | 0 refills | Status: DC | PRN

## 2020-06-24 MED ORDER — PREDNISONE 10 MG (21) PO TBPK: ORAL_TABLET | ORAL | 0 refills | Status: DC

## 2020-06-24 NOTE — Progress Notes (Signed)

## 2020-10-03 NOTE — Patient Instructions (Addendum)
  HEALTH MAINTENANCE RECOMMENDATIONS:  It is recommended that you get at least 30 minutes of aerobic exercise at least 5 days/week (for weight loss, you may need as much as 60-90 minutes). This can be any activity that gets your heart rate up. This can be divided in 10-15 minute intervals if needed, but try and build up your endurance at least once a week.  Weight bearing exercise is also recommended twice weekly.  Eat a healthy diet with lots of vegetables, fruits and fiber.  "Colorful" foods have a lot of vitamins (ie green vegetables, tomatoes, red peppers, etc).  Limit sweet tea, regular sodas and alcoholic beverages, all of which has a lot of calories and sugar.  Up to 2 alcoholic drinks daily may be beneficial for men (unless trying to lose weight, watch sugars).  Drink a lot of water.  Sunscreen of at least SPF 30 should be used on all sun-exposed parts of the skin when outside between the hours of 10 am and 4 pm (not just when at beach or pool, but even with exercise, golf, tennis, and yard work!)  Use a sunscreen that says "broad spectrum" so it covers both UVA and UVB rays, and make sure to reapply every 1-2 hours.  Remember to change the batteries in your smoke detectors when changing your clock times in the spring and fall.  Carbon monoxide detectors are recommended for your home.  Use your seat belt every time you are in a car, and please drive safely and not be distracted with cell phones and texting while driving.  Pay attention to the news regarding newer boosters that should be available late Fall, with better coverage for Omicron variants.  Continue yearly flu shots (not yet available)  Consider using Flonase daily (vs other inhaled nasal steroid). Use neti-pot sinus rinses as needed for any sinus congestion. Consider using Mucinex as needed (guaifenesin, an expectorant).  You may want to pay attention to when you feel the chest symptoms--likely related to caffeine, or other  certain foods (see handout). Try taking pepcid prior to meals that will trigger any reflux symptoms. You may use it twice daily for a week or two and see if this helps.  Consider getting a routine eye exam.  Do the pyriformis stretches for your hip/buttock tightness as shown today

## 2020-10-03 NOTE — Progress Notes (Signed)
Chief Complaint  Patient presents with   Annual Exam    Fasting annual exam. Thinks he has IBS(undiagnosed) had a little episode a few weeks ago. Has been having frequent HA's and sinus pressure without any drainage. Everyday he feels like a has something stuck in his throat-acid reflux?     Jason Casey is a 40 y.o. male who presents for a complete physical.   He has the following concerns:  He has been having some headaches and sinus pressure.  Denies drainage, but does report feeling like something is stuck in his throat. Feels some decreased airflow in the R nostril, and some pressure at the medial R eye intermittently.  Once had a "brain-foggy" headache across forehead, felt "full", was a couple of weeks ago, hasn't recurred. He takes xyzal daily. In the mornings he has some throat-clearing. While walking, has to cough up/spit phlegm, always white. Also having some tension headaches that start at the back of the L neck.  He describes some pressure/gassiness/burning in his upper chest. Not related to meals that he can tell, not occurring at night.  He used to think it was related to carbonated beverages. He drinks 20 ounces of coffee daily, with creamer.  +weight gain, he relates to less exercise, more sodas, fried foods and sweets since February.  Having a little pain in R hip, stopped running in the last 10d.  Recently changed to mostly mediterranean diet.  He was treated for +Quantiferon Gold testing/latent TB, and completed treatment without complications.  H/o eczema, uses TAC 0.1% cream prn for flares, not recently.  He has an area at the top of his buttocks that is dry/itchy, and sometimes notes some rash on hands (bumps on hands, that start peeling).  He had itching and flaking of scalp in the past, but this resolved.  He has had bowel issues (constipation, loose stools), presumed IBS.  He saw Dr. Havery Moros related to rectal bleeding and had colonoscopy in 03/2019. He was noted to  have anal fissure and was recommended to take a daily fiber supplement (the NTG ointment caused HA's, fissure resolved). He has bled since, with a very hard stool, but wasn't prolonged, lasted just 1 day.  He denies any abdominal pain.  H/o elevated TG found on insurance labs in 2020 (fasting 6 hours)--Chol 202, HDL 42.7, LDL 118, TG 205, ratio 4.73. Recheck was normal. Repeated last year when reported a healthy diet--minimal complex carbs, rare fried foods. He had cut back drastically on soda intake, no longer daily. Current diet isn't as good--eating more fried foods, regular sodas 4-5x/week.  Infrequent alcohol.  Lab Results  Component Value Date   CHOL 170 10/14/2019   HDL 40 10/14/2019   LDLCALC 112 (H) 10/14/2019   TRIG 99 10/14/2019   CHOLHDL 4.3 10/14/2019     Immunization History  Administered Date(s) Administered   DTaP 11/12/1980, 02/14/1981, 04/30/1981, 06/20/1982   IPV 11/12/1980, 02/14/1981, 04/30/1981, 06/20/1982   Influenza Inj Mdck Quad Pf 11/30/2018   Influenza,inj,Quad PF,6+ Mos 12/03/2017, 12/02/2018, 12/01/2019   Influenza-Unspecified 12/03/2017   MMR 01/04/1982, 09/21/2017   PFIZER(Purple Top)SARS-COV-2 Vaccination 05/06/2019, 05/27/2019, 12/01/2019   PPD Test 09/21/2017   Tdap 09/21/2017   Last colonoscopy: 03/2019 Dr. Havery Moros. Noted to have anal fissure, internal hemorrhoids, hypertrophied anal papilla(e) Ophtho: never Dentist:  Twice yearly Exercise:  irregular/sporadic, some running, walking, no recent weights.   PMH, PSH, SH and FH were reviewed and updated  Outpatient Encounter Medications as of 10/04/2020  Medication Sig  levocetirizine (XYZAL) 5 MG tablet Take 5 mg by mouth every evening.   methylcellulose (CITRUCEL) oral powder Take 1 packet by mouth daily.   [DISCONTINUED] pyridOXINE (VITAMIN B-6) 25 MG tablet Take 1 tablet (25 mg total) by mouth daily.   aspirin-acetaminophen-caffeine (EXCEDRIN MIGRAINE) 250-250-65 MG tablet Take by mouth  every 6 (six) hours as needed for headache. (Patient not taking: Reported on 10/04/2020)   triamcinolone cream (KENALOG) 0.1 % Apply 1 application topically 2 (two) times daily. (Patient not taking: No sig reported)   [DISCONTINUED] acetaminophen (TYLENOL) 325 MG tablet Take 650 mg by mouth every 6 (six) hours as needed. (Patient not taking: No sig reported)   [DISCONTINUED] benzonatate (TESSALON PERLES) 100 MG capsule Take 1 capsule (100 mg total) by mouth 3 (three) times daily as needed.   [DISCONTINUED] isoniazid (NYDRAZID) 300 MG tablet TAKE 1 TABLET BY MOUTH EVERY DAY   [DISCONTINUED] predniSONE (STERAPRED UNI-PAK 21 TAB) 10 MG (21) TBPK tablet Use as directed   No facility-administered encounter medications on file as of 10/04/2020.   Takes Citricel (fiber supplement) daily  No Known Allergies   ROS:  The patient denies anorexia, fever, vision loss, decreased hearing, ear pain, hoarseness, palpitations, dizziness, syncope, dyspnea on exertion, cough, swelling, nausea, vomiting, abdominal pain, melena, hematuria, incontinence, erectile dysfunction, nocturia, weakened urine stream, dysuria, genital lesions, weakness, tremor, suspicious skin lesions, depression, anxiety, abnormal bleeding/bruising, or enlarged lymph nodes. Denies exertional chest pain. Mild eczema per HPI Some trouble falling and staying asleep when stressed, just sporadic. Gets some low back pain and stiffness.  PT in the past wasn't particularly helpful. Having some recent pain at R>L hips. Sinus headaches and possible reflux per HPI.   PHYSICAL EXAM:  BP 110/70   Pulse 64   Ht 5' 9.75" (1.772 m)   Wt 211 lb 3.2 oz (95.8 kg)   BMI 30.52 kg/m   Wt Readings from Last 3 Encounters:  10/04/20 211 lb 3.2 oz (95.8 kg)  12/01/19 196 lb 12.8 oz (89.3 kg)  09/29/19 (!) 203 lb 6.4 oz (92.3 kg)    General Appearance:    Alert, cooperative, no distress, appears stated age  Head:    Normocephalic, without obvious  abnormality, atraumatic  Eyes:    PERRL, conjunctiva/corneas clear, EOM's intact, fundi    benign  Ears:    Normal TM's and external ear canals  Nose:   Not examined, wearing mask due to COVID-19 pandemic  Throat:   Not examined, wearing mask due to COVID-19 pandemic  Neck:   Supple, no lymphadenopathy;  thyroid:  no enlargement/ tenderness/nodules; no carotid bruit or JVD  Back:    Spine nontender, no curvature, ROM normal, no CVA  tenderness. He is tender at R SI joint, and some tenderness at pyriformis and with pyriformis stretch (good ROM).  Lungs:     Clear to auscultation bilaterally without wheezes, rales or ronchi; respirations unlabored  Chest Wall:    No tenderness or deformity.    Heart:    Regular rate and rhythm, S1 and S2 normal, no murmur, rub or gallop  Breast Exam:    No chest wall tenderness, masses or gynecomastia.   Abdomen:     Soft, non-tender, nondistended, normoactive bowel sounds, no masses, no hepatosplenomegaly  Genitalia:    Normal male external genitalia without lesions.  Testicles without masses. No inguinal hernias present.  Rectal:   Normal sphincter tone, no masses. Prostate is smooth, not enlarged, no nodules, nontender.  Heme negative stool. Somewhat  mobile, nontender internal hemorrhoid noted. No fissures.  Extremities:   No clubbing, cyanosis or edema.  Pulses:   2+ and symmetric all extremities  Skin:   Skin color, texture, turgor normal. Dry, hyperkeratotic area at top of gluteal cleft, slightly excoriated. No erythema or crusting.   Lymph nodes:   Cervical, supraclavicular, and axillary nodes normal  Neurologic:   Normal strength, sensation and gait; reflexes 2+ and symmetric throughout                            Psych:   Normal mood, affect, hygiene and grooming   ASSESSMENT/PLAN:  Annual physical exam - Plan: Visual acuity screening, Lipid panel, Comprehensive metabolic panel, CBC with Differential/Platelet  TB lung, latent - completed course of  INH  BMI 30.0-30.9,adult - counseled re: healthy diet, exercise, wt loss  Eczema, unspecified type - location at gluteal cleft suggests psoriasis. no other rash noted currently, reports on hands. TAC prn, moisturize frequently.  Allergic rhinitis, unspecified seasonality, unspecified trigger - cont xyzal. Suggested trial of Flonase. Discussed mucinex/sinus rinses prn. No e/o infection  Gastroesophageal reflux disease without esophagitis - reviewed triggers; encouraged wt loss, proper diet, precautions. Discussed OTC meds to use prn  History of anal fissures - none presently; suspect recent bleeding may have been from internal hemorrhoids. Discussed diltiazem if recurs (didn't tolerate topical NTG)  Discussed probably IBS, the recommendation to continue high fiber diet. Significant time spent counseling re: headaches, allergies, reflux and GI complaints.  Recommended at least 30 minutes of aerobic activity at least 5 days/week, weight-bearing exercise at least 2x/week; proper sunscreen use reviewed; healthy diet and alcohol recommendations (less than or equal to 2 drinks/day) reviewed; regular seatbelt use; changing batteries in smoke detectors, having carbon monoxide detectors. Immunization recommendations discussed, yearly flu shots recommended.   F/u 1 year, sooner prn

## 2020-10-04 ENCOUNTER — Other Ambulatory Visit: Payer: Self-pay

## 2020-10-04 ENCOUNTER — Ambulatory Visit: Payer: BC Managed Care – PPO | Admitting: Family Medicine

## 2020-10-04 ENCOUNTER — Encounter: Payer: Self-pay | Admitting: Family Medicine

## 2020-10-04 VITALS — BP 110/70 | HR 64 | Ht 69.75 in | Wt 211.2 lb

## 2020-10-04 DIAGNOSIS — Z6830 Body mass index (BMI) 30.0-30.9, adult: Secondary | ICD-10-CM | POA: Diagnosis not present

## 2020-10-04 DIAGNOSIS — Z8719 Personal history of other diseases of the digestive system: Secondary | ICD-10-CM

## 2020-10-04 DIAGNOSIS — Z227 Latent tuberculosis: Secondary | ICD-10-CM

## 2020-10-04 DIAGNOSIS — L309 Dermatitis, unspecified: Secondary | ICD-10-CM

## 2020-10-04 DIAGNOSIS — Z0000 Encounter for general adult medical examination without abnormal findings: Secondary | ICD-10-CM | POA: Diagnosis not present

## 2020-10-04 DIAGNOSIS — K219 Gastro-esophageal reflux disease without esophagitis: Secondary | ICD-10-CM

## 2020-10-04 DIAGNOSIS — J309 Allergic rhinitis, unspecified: Secondary | ICD-10-CM

## 2020-10-05 ENCOUNTER — Encounter: Payer: Self-pay | Admitting: Family Medicine

## 2020-10-05 LAB — COMPREHENSIVE METABOLIC PANEL
ALT: 14 IU/L (ref 0–44)
AST: 17 IU/L (ref 0–40)
Albumin/Globulin Ratio: 1.7 (ref 1.2–2.2)
Albumin: 4.7 g/dL (ref 4.0–5.0)
Alkaline Phosphatase: 123 IU/L — ABNORMAL HIGH (ref 44–121)
BUN/Creatinine Ratio: 11 (ref 9–20)
BUN: 14 mg/dL (ref 6–24)
Bilirubin Total: 0.5 mg/dL (ref 0.0–1.2)
CO2: 26 mmol/L (ref 20–29)
Calcium: 10 mg/dL (ref 8.7–10.2)
Chloride: 102 mmol/L (ref 96–106)
Creatinine, Ser: 1.25 mg/dL (ref 0.76–1.27)
Globulin, Total: 2.7 g/dL (ref 1.5–4.5)
Glucose: 92 mg/dL (ref 65–99)
Potassium: 5.2 mmol/L (ref 3.5–5.2)
Sodium: 141 mmol/L (ref 134–144)
Total Protein: 7.4 g/dL (ref 6.0–8.5)
eGFR: 75 mL/min/{1.73_m2} (ref >59)

## 2020-10-05 LAB — CBC WITH DIFFERENTIAL/PLATELET
Basophils Absolute: 0.1 10*3/uL (ref 0.0–0.2)
Basos: 1 %
EOS (ABSOLUTE): 0.2 10*3/uL (ref 0.0–0.4)
Eos: 3 %
Hematocrit: 48.7 % (ref 37.5–51.0)
Hemoglobin: 16.7 g/dL (ref 13.0–17.7)
Immature Grans (Abs): 0 10*3/uL (ref 0.0–0.1)
Immature Granulocytes: 1 %
Lymphocytes Absolute: 2.9 10*3/uL (ref 0.7–3.1)
Lymphs: 36 %
MCH: 32.2 pg (ref 26.6–33.0)
MCHC: 34.3 g/dL (ref 31.5–35.7)
MCV: 94 fL (ref 79–97)
Monocytes Absolute: 0.7 10*3/uL (ref 0.1–0.9)
Monocytes: 8 %
Neutrophils Absolute: 4.2 10*3/uL (ref 1.4–7.0)
Neutrophils: 51 %
Platelets: 344 10*3/uL (ref 150–450)
RBC: 5.19 x10E6/uL (ref 4.14–5.80)
RDW: 11.8 % (ref 11.6–15.4)
WBC: 8.1 10*3/uL (ref 3.4–10.8)

## 2020-10-05 LAB — LIPID PANEL
Chol/HDL Ratio: 4.8 ratio (ref 0.0–5.0)
Cholesterol, Total: 218 mg/dL — ABNORMAL HIGH (ref 100–199)
HDL: 45 mg/dL (ref >39)
LDL Chol Calc (NIH): 147 mg/dL — ABNORMAL HIGH (ref 0–99)
Triglycerides: 144 mg/dL (ref 0–149)
VLDL Cholesterol Cal: 26 mg/dL (ref 5–40)

## 2020-10-24 ENCOUNTER — Encounter: Payer: Self-pay | Admitting: Family Medicine

## 2020-12-13 ENCOUNTER — Encounter: Payer: Self-pay | Admitting: Family Medicine

## 2021-01-17 ENCOUNTER — Encounter: Payer: Self-pay | Admitting: Family Medicine

## 2021-01-17 ENCOUNTER — Ambulatory Visit
Admission: RE | Admit: 2021-01-17 | Discharge: 2021-01-17 | Disposition: A | Discharge status: Home / Self Care | Payer: BC Managed Care – PPO | Source: Ambulatory Visit | Attending: Family Medicine | Admitting: Family Medicine

## 2021-01-17 ENCOUNTER — Ambulatory Visit: Payer: BC Managed Care – PPO | Admitting: Family Medicine

## 2021-01-17 ENCOUNTER — Other Ambulatory Visit: Payer: Self-pay

## 2021-01-17 VITALS — BP 118/78 | HR 84 | Ht 69.75 in | Wt 221.4 lb

## 2021-01-17 DIAGNOSIS — M79671 Pain in right foot: Secondary | ICD-10-CM

## 2021-01-17 MED ORDER — MELOXICAM 15 MG PO TABS: 15.0000 mg | ORAL_TABLET | Freq: Every day | ORAL | 0 refills | Status: DC

## 2021-01-17 NOTE — Progress Notes (Signed)
Chief Complaint  Patient presents with   Toe Pain    Right great toe, red and swollen x 1 week. Last night was the worst-he was almost in pain. He has pics from last night.    Noted some pain in the right foot starting about a week ago--pain with walking, at the ball of his foot, and at the great toe.  No pain at rest, just with walking. Last night he noticed swelling, discoloration.  Prior to that he had some days where it felt better, coming and going. 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 In shoes all day--not new, wearing this shoe for 3-4 months.  He was limping some yesterday, foot got more sore throughout the day. When he bent the foot to take off the shoe, 10/10 pain. He was aware of the sheet touching his toe, but not unbearable.  He has taken 800mg  ibuprofen sporadically throughout the week (some days didn't take any, some days took just once, never more than once).  Took 800mg  yesterday morning and this morning; had taken Excedrin last night for HA.  Some days ibuprofen helped a lot, but about a week ago. Didn't seem to help much recently.  No changes in his diet. +FH gout in father (a flare 15 years ago). H/o PF and sore heel, denies these issues now.  He showed me many photos on his phone from last night and this morning. There was swelling and discoloration at the 1st MTP (purplish discoloration medially). There was an area of erythema on the dorsum of the foot, between the distal 1st and 2nd MTP, not over a joint. There was a photo from this morning showing purplish discoloration of all of the toes on the R foot (with sharp demarcation, only involving the toes, not extending to the foot).   No numbness/tingling in toes No h/o Raynauds, denies seeing the toes look white or red  PMH, PSH, SH reviewed  Outpatient Encounter Medications as of 01/17/2021  Medication Sig Note   aspirin-acetaminophen-caffeine (EXCEDRIN MIGRAINE)  250-250-65 MG tablet Take by mouth every 6 (six) hours as needed for headache. 01/17/2021: Took last night 10:30   fluticasone (FLONASE) 50 MCG/ACT nasal spray Place 1 spray into both nostrils daily.    levocetirizine (XYZAL) 5 MG tablet Take 5 mg by mouth every evening.    meloxicam (MOBIC) 15 MG tablet Take 1 tablet (15 mg total) by mouth daily. Take with food, and take daily until pain resolved    methylcellulose (CITRUCEL) oral powder Take 1 packet by mouth daily.    triamcinolone cream (KENALOG) 0.1 % Apply 1 application topically 2 (two) times daily. (Patient not taking: Reported on 12/01/2019)    No facility-administered encounter medications on file as of 01/17/2021.   No Known Allergies  ROS: no fever, chills. No other joint pains.  No current URI symptoms, HA (had one last night). No n/v/d, other skin concerns.  See HPI   PHYSICAL EXAM:  BP 118/78   Pulse 84   Ht 5' 9.75" (1.772 m)   Wt 221 lb 6.4 oz (100.4 kg)   BMI 32.00 kg/m   Well-appearing, pleasant male in no distress. R foot--some soft tissue swelling of the medial foot--no significant swelling or erythema of the great toe itself. No e/o ingrowing nail. Focal area of erythema noted on the dorsum of the foot, between the distal 1st and 2nd MTP's.  There was no puncture wound, or other  skin abnormalities on top of to, between the toes, or on the bottom of the foot. He had discomfort with dorsiflexion of the great toe--caused a discomfort on the bottom of the foot (lateral to the 1st MT head) 2+ pulses (DP and PT) and brisk capillary refill Normal sensation to light touch.  ASSESSMENT/PLAN:  Right foot pain - Ddx reviewed--check labs, XR, trial of NSAID, elevation prn, topical meds or icing prn. To podiatrist if not improving. - Plan: CBC with Differential/Platelet, Uric acid, meloxicam (MOBIC) 15 MG tablet, DG Foot Complete Right, CANCELED: DG Foot Complete Right  History/exam is not consistent with gout (stuttering  pain over the last week). There is no evidence of infection (but focal area of erythema on top of foot), trauma, insect bite/sting. H/o not c/w stress fracture. Suspect poss tendonitis. If not responding to NSAID, and if labs and XR normal, will refer to podiatry for further evaluation.  Risks/SE of NSAIDs reviewed. All questions answered.

## 2021-01-17 NOTE — Patient Instructions (Addendum)
Keep the foot elevated (will help with swelling and throbbing). Take the meloxicam once daily with food until it is better. We are checking an x-ray and bloodwork. We will get you these results via MyChart.  I don't think this is gout, but if uric acid levels are elevated I will send you more information on a low purine diet.

## 2021-01-18 LAB — CBC WITH DIFFERENTIAL/PLATELET
Basophils Absolute: 0.1 10*3/uL (ref 0.0–0.2)
Basos: 1 %
EOS (ABSOLUTE): 0.2 10*3/uL (ref 0.0–0.4)
Eos: 3 %
Hematocrit: 46.8 % (ref 37.5–51.0)
Hemoglobin: 16.1 g/dL (ref 13.0–17.7)
Immature Grans (Abs): 0.1 10*3/uL (ref 0.0–0.1)
Immature Granulocytes: 1 %
Lymphocytes Absolute: 2.8 10*3/uL (ref 0.7–3.1)
Lymphs: 34 %
MCH: 31.9 pg (ref 26.6–33.0)
MCHC: 34.4 g/dL (ref 31.5–35.7)
MCV: 93 fL (ref 79–97)
Monocytes Absolute: 0.7 10*3/uL (ref 0.1–0.9)
Monocytes: 9 %
Neutrophils Absolute: 4.4 10*3/uL (ref 1.4–7.0)
Neutrophils: 52 %
Platelets: 334 10*3/uL (ref 150–450)
RBC: 5.05 x10E6/uL (ref 4.14–5.80)
RDW: 12.2 % (ref 11.6–15.4)
WBC: 8.2 10*3/uL (ref 3.4–10.8)

## 2021-01-18 LAB — URIC ACID: Uric Acid: 4.9 mg/dL (ref 3.8–8.4)

## 2021-02-21 ENCOUNTER — Telehealth: Payer: BC Managed Care – PPO | Admitting: Physician Assistant

## 2021-02-21 DIAGNOSIS — J111 Influenza due to unidentified influenza virus with other respiratory manifestations: Secondary | ICD-10-CM

## 2021-02-21 MED ORDER — OSELTAMIVIR PHOSPHATE 75 MG PO CAPS
75.0000 mg | ORAL_CAPSULE | Freq: Two times a day (BID) | ORAL | 0 refills | Status: DC
Start: 2021-02-21 — End: 2021-10-10

## 2021-02-21 NOTE — Progress Notes (Signed)
E visit for Flu like symptoms   We are sorry that you are not feeling well.  Here is how we plan to help! Based on what you have shared with me it looks like you may have a respiratory virus that may be influenza.  Influenza or the flu is   an infection caused by a respiratory virus. The flu virus is highly contagious and persons who did not receive their yearly flu vaccination may catch the flu from close contact.  We have anti-viral medications to treat the viruses that cause this infection. They are not a cure and only shorten the course of the infection. These prescriptions are most effective when they are given within the first 2 days of flu symptoms. Antiviral medication are indicated if you have a high risk of complications from the flu. You should  also consider an antiviral medication if you are in close contact with someone who is at risk. These medications can help patients avoid complications from the flu  but have side effects that you should know. Possible side effects from Tamiflu or oseltamivir include nausea, vomiting, diarrhea, dizziness, headaches, eye redness, sleep problems or other respiratory symptoms. You should not take Tamiflu if you have an allergy to oseltamivir or any to the ingredients in Tamiflu.  Based upon your symptoms and potential risk factors I have prescribed Oseltamivir (Tamiflu).  It has been sent to your designated pharmacy.  You will take one 75 mg capsule orally twice a day for the next 5 days.  Of note: If your symptoms started Monday, it is very possible you may see no benefit in the Tamiflu. I am still prescribing since your fever just started yesterday.   ANYONE WHO HAS FLU SYMPTOMS SHOULD: Stay home. The flu is highly contagious and going out or to work exposes others! Be sure to drink plenty of fluids. Water is fine as well as fruit juices, sodas and electrolyte beverages. You may want to stay away from caffeine or alcohol. If you are  nauseated, try taking small sips of liquids. How do you know if you are getting enough fluid? Your urine should be a pale yellow or almost colorless. Get rest. Taking a steamy shower or using a humidifier may help nasal congestion and ease sore throat pain. Using a saline nasal spray works much the same way. Cough drops, hard candies and sore throat lozenges may ease your cough. Line up a caregiver. Have someone check on you regularly.   GET HELP RIGHT AWAY IF: You cannot keep down liquids or your medications. You become short of breath Your fell like you are going to pass out or loose consciousness. Your symptoms persist after you have completed your treatment plan MAKE SURE YOU  Understand these instructions. Will watch your condition. Will get help right away if you are not doing well or get worse.  Your e-visit answers were reviewed by a board certified advanced clinical practitioner to complete your personal care plan.  Depending on the condition, your plan could have included both over the counter or prescription medications.  If there is a problem please reply  once you have received a response from your provider.  Your safety is important to [place].  If you have drug allergies check your prescription carefully.    You can use MyChart to ask questions about todays visit, request a non-urgent call back, or ask for a work or school excuse for 24 hours related to this e-Visit. If it has been  greater than 24 hours you will need to follow up with your provider, or enter a new e-Visit to address those concerns.  You will get an e-mail in the next two days asking about your experience.  I hope that your e-visit has been valuable and will speed your recovery. Thank you for using e-visits.  I provided 5 minutes of non face-to-face time during this encounter for chart review and documentation.

## 2021-05-25 ENCOUNTER — Encounter: Payer: Self-pay | Admitting: Family Medicine

## 2021-10-08 ENCOUNTER — Encounter: Payer: Self-pay | Admitting: Family Medicine

## 2021-10-08 NOTE — Progress Notes (Unsigned)
No chief complaint on file.   Jason Casey is a 41 y.o. male who presents for a complete physical.   He has the following concerns:  Seen in November with R foot pain. Treated with mobic. XR showed some soft tissue swelling over the dorsum of the foot.  H/o eczema, uses TAC 0.1% cream prn for flares, not recently.    He has had bowel issues (constipation, loose stools), presumed IBS.  He saw Dr. Havery Moros related to rectal bleeding and had colonoscopy in 03/2019. He was noted to have anal fissure and was recommended to take a daily fiber supplement (the NTG ointment caused HA's, fissure resolved).  He denies any abdominal pain. ?further bleeding?  H/o elevated TG found on insurance labs in 2020 (fasting 6 hours)--Chol 202, HDL 42.7, LDL 118, TG 205, ratio 4.73. Recheck was normal. Last year he reported that his diet wasn't as good--eating more fried foods, regular sodas 4-5x/week.  Infrequent alcohol. LDL was up. Due for recheck  Lab Results  Component Value Date   CHOL 218 (H) 10/04/2020   HDL 45 10/04/2020   LDLCALC 147 (H) 10/04/2020   TRIG 144 10/04/2020   CHOLHDL 4.8 10/04/2020     Immunization History  Administered Date(s) Administered   DTaP 11/12/1980, 02/14/1981, 04/30/1981, 06/20/1982   IPV 11/12/1980, 02/14/1981, 04/30/1981, 06/20/1982   Influenza Inj Mdck Quad Pf 11/30/2018   Influenza,inj,Quad PF,6+ Mos 12/03/2017, 12/02/2018, 12/01/2019   Influenza-Unspecified 12/03/2017, 11/09/2020   MMR 01/04/1982, 09/21/2017   PFIZER(Purple Top)SARS-COV-2 Vaccination 05/06/2019, 05/27/2019, 12/01/2019   PPD Test 09/21/2017   Pfizer Covid-19 Vaccine Bivalent Booster 5y-11y 11/09/2020   Tdap 09/21/2017   Last colonoscopy: 03/2019 Dr. Havery Moros. Noted to have anal fissure, internal hemorrhoids, hypertrophied anal papilla(e) Ophtho: never Dentist:  Twice yearly Exercise:   irregular/sporadic, some running, walking, no recent weights.   PMH, PSH, SH and FH were reviewed  and updated   ROS:  The patient denies anorexia, fever, vision loss, decreased hearing, ear pain, hoarseness, palpitations, dizziness, syncope, dyspnea on exertion, cough, swelling, nausea, vomiting, abdominal pain, melena, hematuria, incontinence, erectile dysfunction, nocturia, weakened urine stream, dysuria, genital lesions, weakness, tremor, suspicious skin lesions, depression, anxiety, abnormal bleeding/bruising, or enlarged lymph nodes.  Denies exertional chest pain. Mild eczema per HPI Some trouble falling and staying asleep when stressed, just sporadic. Gets some low back pain and stiffness.  PT in the past wasn't particularly helpful.   PHYSICAL EXAM:  There were no vitals taken for this visit.  Wt Readings from Last 3 Encounters:  01/17/21 221 lb 6.4 oz (100.4 kg)  10/04/20 211 lb 3.2 oz (95.8 kg)  12/01/19 196 lb 12.8 oz (89.3 kg)    General Appearance:    Alert, cooperative, no distress, appears stated age  Head:    Normocephalic, without obvious abnormality, atraumatic  Eyes:    PERRL, conjunctiva/corneas clear, EOM's intact, fundi    benign  Ears:    Normal TM's and external ear canals  Nose:   Normal, no drainage or sinus tenderness  Throat:   Normal mucosa, no lesions  Neck:   Supple, no lymphadenopathy;  thyroid:  no enlargement/ tenderness/nodules; no carotid bruit or JVD  Back:    Spine nontender, no curvature, ROM normal, no CVA  tenderness.   Lungs:     Clear to auscultation bilaterally without wheezes, rales or ronchi; respirations unlabored  Chest Wall:    No tenderness or deformity.    Heart:    Regular rate and rhythm, S1 and S2  normal, no murmur, rub or gallop  Breast Exam:    No chest wall tenderness, masses or gynecomastia.   Abdomen:     Soft, non-tender, nondistended, normoactive bowel sounds, no masses, no hepatosplenomegaly  Genitalia:    Normal male external genitalia without lesions.  Testicles without masses. No inguinal hernias present.  Rectal:    Normal sphincter tone, no masses. Prostate is smooth, not enlarged, no nodules, nontender.  Heme negative stool. Somewhat mobile, nontender internal hemorrhoid noted. No fissures.  Extremities:   No clubbing, cyanosis or edema.  Pulses:   2+ and symmetric all extremities  Skin:   Skin color, texture, turgor normal. No rashes or suspicious lesions  Lymph nodes:   Cervical, supraclavicular, and inguinal  nodes normal  Neurologic:   Normal strength, sensation and gait; reflexes 2+ and symmetric throughout                            Psych:   Normal mood, affect, hygiene and grooming  ***update internal hemorrhoids/recrtal  ASSESSMENT/PLAN:   Lipids, hep C, c-met Consider TSH if weight gain/sx  Recommended at least 30 minutes of aerobic activity at least 5 days/week, weight-bearing exercise at least 2x/week; proper sunscreen use reviewed; healthy diet and alcohol recommendations (less than or equal to 2 drinks/day) reviewed; regular seatbelt use; changing batteries in smoke detectors, having carbon monoxide detectors. Immunization recommendations discussed, yearly flu shots recommended.  Newly updated COVID booster when available in the Fall.  F/u 1 year, sooner prn

## 2021-10-08 NOTE — Patient Instructions (Incomplete)
  HEALTH MAINTENANCE RECOMMENDATIONS:  It is recommended that you get at least 30 minutes of aerobic exercise at least 5 days/week (for weight loss, you may need as much as 60-90 minutes). This can be any activity that gets your heart rate up. This can be divided in 10-15 minute intervals if needed, but try and build up your endurance at least once a week.  Weight bearing exercise is also recommended twice weekly.  Eat a healthy diet with lots of vegetables, fruits and fiber.  "Colorful" foods have a lot of vitamins (ie green vegetables, tomatoes, red peppers, etc).  Limit sweet tea, regular sodas and alcoholic beverages, all of which has a lot of calories and sugar.  Up to 2 alcoholic drinks daily may be beneficial for men (unless trying to lose weight, watch sugars).  Drink a lot of water.  Sunscreen of at least SPF 30 should be used on all sun-exposed parts of the skin when outside between the hours of 10 am and 4 pm (not just when at beach or pool, but even with exercise, golf, tennis, and yard work!)  Use a sunscreen that says "broad spectrum" so it covers both UVA and UVB rays, and make sure to reapply every 1-2 hours.  Remember to change the batteries in your smoke detectors when changing your clock times in the spring and fall.  Carbon monoxide detectors are recommended for your home.  Use your seat belt every time you are in a car, and please drive safely and not be distracted with cell phones and texting while driving.  Get flu shots yearly. Get the updated bivalent COVID booster when it becomes available this Fall.  Consider a routine eye exam.  Use gentle sniffs of the nasal steroid. Consider switching up your steroids and antihistamines. Add mucinex as needed (plain guaifenesin). Consider an allergy consult if ongoing issues.  See if you can find out if the thyroid cancer in your grandfather and great uncle was medullary vs follicular.

## 2021-10-10 ENCOUNTER — Encounter: Payer: Self-pay | Admitting: Family Medicine

## 2021-10-10 ENCOUNTER — Ambulatory Visit: Payer: BC Managed Care – PPO | Admitting: Family Medicine

## 2021-10-10 VITALS — BP 110/70 | HR 75 | Ht 70.0 in | Wt 207.2 lb

## 2021-10-10 DIAGNOSIS — Z6829 Body mass index (BMI) 29.0-29.9, adult: Secondary | ICD-10-CM | POA: Diagnosis not present

## 2021-10-10 DIAGNOSIS — E782 Mixed hyperlipidemia: Secondary | ICD-10-CM | POA: Diagnosis not present

## 2021-10-10 DIAGNOSIS — R635 Abnormal weight gain: Secondary | ICD-10-CM | POA: Diagnosis not present

## 2021-10-10 DIAGNOSIS — J309 Allergic rhinitis, unspecified: Secondary | ICD-10-CM

## 2021-10-10 DIAGNOSIS — L309 Dermatitis, unspecified: Secondary | ICD-10-CM | POA: Diagnosis not present

## 2021-10-10 DIAGNOSIS — Z1159 Encounter for screening for other viral diseases: Secondary | ICD-10-CM

## 2021-10-10 DIAGNOSIS — Z0000 Encounter for general adult medical examination without abnormal findings: Secondary | ICD-10-CM

## 2021-10-11 LAB — COMPREHENSIVE METABOLIC PANEL
ALT: 12 IU/L (ref 0–44)
AST: 16 IU/L (ref 0–40)
Albumin/Globulin Ratio: 1.9 (ref 1.2–2.2)
Albumin: 4.7 g/dL (ref 4.1–5.1)
Alkaline Phosphatase: 111 IU/L (ref 44–121)
BUN/Creatinine Ratio: 9 (ref 9–20)
BUN: 11 mg/dL (ref 6–24)
Bilirubin Total: 0.8 mg/dL (ref 0.0–1.2)
CO2: 24 mmol/L (ref 20–29)
Calcium: 9.7 mg/dL (ref 8.7–10.2)
Chloride: 101 mmol/L (ref 96–106)
Creatinine, Ser: 1.16 mg/dL (ref 0.76–1.27)
Globulin, Total: 2.5 g/dL (ref 1.5–4.5)
Glucose: 94 mg/dL (ref 70–99)
Potassium: 4.7 mmol/L (ref 3.5–5.2)
Sodium: 142 mmol/L (ref 134–144)
Total Protein: 7.2 g/dL (ref 6.0–8.5)
eGFR: 81 mL/min/{1.73_m2} (ref >59)

## 2021-10-11 LAB — CBC WITH DIFFERENTIAL/PLATELET
Basophils Absolute: 0.1 10*3/uL (ref 0.0–0.2)
Basos: 1 %
EOS (ABSOLUTE): 0.1 10*3/uL (ref 0.0–0.4)
Eos: 2 %
Hematocrit: 48.8 % (ref 37.5–51.0)
Hemoglobin: 16.5 g/dL (ref 13.0–17.7)
Immature Grans (Abs): 0 10*3/uL (ref 0.0–0.1)
Immature Granulocytes: 0 %
Lymphocytes Absolute: 2.5 10*3/uL (ref 0.7–3.1)
Lymphs: 35 %
MCH: 32.4 pg (ref 26.6–33.0)
MCHC: 33.8 g/dL (ref 31.5–35.7)
MCV: 96 fL (ref 79–97)
Monocytes Absolute: 0.6 10*3/uL (ref 0.1–0.9)
Monocytes: 8 %
Neutrophils Absolute: 3.9 10*3/uL (ref 1.4–7.0)
Neutrophils: 54 %
Platelets: 321 10*3/uL (ref 150–450)
RBC: 5.09 x10E6/uL (ref 4.14–5.80)
RDW: 12.4 % (ref 11.6–15.4)
WBC: 7.2 10*3/uL (ref 3.4–10.8)

## 2021-10-11 LAB — LIPID PANEL
Chol/HDL Ratio: 4.9 ratio (ref 0.0–5.0)
Cholesterol, Total: 222 mg/dL — ABNORMAL HIGH (ref 100–199)
HDL: 45 mg/dL (ref >39)
LDL Chol Calc (NIH): 148 mg/dL — ABNORMAL HIGH (ref 0–99)
Triglycerides: 162 mg/dL — ABNORMAL HIGH (ref 0–149)
VLDL Cholesterol Cal: 29 mg/dL (ref 5–40)

## 2021-10-11 LAB — HEPATITIS C ANTIBODY: Hep C Virus Ab: NONREACTIVE

## 2021-10-11 LAB — TSH: TSH: 1.81 u[IU]/mL (ref 0.450–4.500)

## 2021-11-06 ENCOUNTER — Encounter: Payer: Self-pay | Admitting: Internal Medicine

## 2021-12-05 ENCOUNTER — Other Ambulatory Visit (INDEPENDENT_AMBULATORY_CARE_PROVIDER_SITE_OTHER): Payer: BC Managed Care – PPO

## 2021-12-05 DIAGNOSIS — Z23 Encounter for immunization: Secondary | ICD-10-CM | POA: Diagnosis not present

## 2021-12-12 ENCOUNTER — Ambulatory Visit: Payer: BC Managed Care – PPO | Admitting: Family Medicine

## 2021-12-12 ENCOUNTER — Encounter: Payer: Self-pay | Admitting: Family Medicine

## 2021-12-12 VITALS — BP 114/78 | HR 72 | Temp 98.1°F | Ht 70.0 in | Wt 208.8 lb

## 2021-12-12 DIAGNOSIS — A084 Viral intestinal infection, unspecified: Secondary | ICD-10-CM | POA: Diagnosis not present

## 2021-12-12 NOTE — Progress Notes (Signed)
Chief Complaint  Patient presents with   Abdominal Pain    Abdominal pain since Monday, moves around from left side, to upper and lower area today. Had diarrhea yesterday and today. No vomiting but is very nauseous. Could not give UA.   10/9 he started with some head congestion, cleared up quickly, then stomach started bothering him.  He had slight cramps, bloating, nausea. Recalls that his tie felt tight on his neck (like when nauseated). He had LG fever Mon/Tues (max 99.6) No BM Tuesday (hadn't eaten much) Loose stool yesterday morning Today 3 watery stools. Stil having some cramps, moves around in location. Didn't eat much Mon/Tues Yesterday had banana, yogurt, chips. Banana and rice today, saltines Decreased appetite, though "inhaled" the rice.  His wife has been battling with mono since end of August. 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 No camping/traveling +recent antibiotics for a dental infection, root canal a week ago. Amox, completed about 3 weeks ago.   PMH, PSH, SH reviewed  Outpatient Encounter Medications as of 12/12/2021  Medication Sig Note   bismuth subsalicylate (PEPTO BISMOL) 262 MG chewable tablet Chew 524 mg by mouth as needed. 12/12/2021: Took 2 tablets TID yesterday   fluticasone (FLONASE) 50 MCG/ACT nasal spray Place 1 spray into both nostrils daily.    loratadine (CLARITIN) 10 MG tablet Take 10 mg by mouth daily.    methylcellulose (CITRUCEL) oral powder Take 1 packet by mouth daily.    aspirin-acetaminophen-caffeine (EXCEDRIN MIGRAINE) 250-250-65 MG tablet Take by mouth every 6 (six) hours as needed for headache. (Patient not taking: Reported on 10/10/2021) 10/10/2021: As needed   triamcinolone cream (KENALOG) 0.1 % Apply 1 application topically 2 (two) times daily. (Patient not taking: Reported on 12/01/2019) 12/12/2021: prn   [DISCONTINUED] levocetirizine (XYZAL) 5 MG tablet Take 5 mg by mouth every evening. (Patient not taking: Reported on  10/10/2021)    [DISCONTINUED] meloxicam (MOBIC) 15 MG tablet Take 1 tablet (15 mg total) by mouth daily. Take with food, and take daily until pain resolved (Patient not taking: Reported on 10/10/2021) 10/10/2021: As needed   No facility-administered encounter medications on file as of 12/12/2021.   No Known Allergies  ROS: LG fever and GI complaints per HPI. No urinary complaints. Congestion resolved, denies URI complaints, cough, shortness of breath.  No chest pain. Mild intermittent nausea, no vomiting. No bleeding, rash, myalgias, or other concerns. See HPI.   PHYSICAL EXAM:  BP 114/78   Pulse 72   Temp 98.1 F (36.7 C) (Tympanic)   Ht 5\' 10"  (1.778 m)   Wt 208 lb 12.8 oz (94.7 kg)   BMI 29.96 kg/m   Wt Readings from Last 3 Encounters:  12/12/21 208 lb 12.8 oz (94.7 kg)  10/10/21 207 lb 3.2 oz (94 kg)  01/17/21 221 lb 6.4 oz (100.4 kg)   Well-appearing, pleasant male in no distress HEENT: conjunctiva and sclera are clear, EOMI.  Nasal mucosa with mild edema, no purulence, clear mucus Sinuses nontender OP clear, moist mucus membranes Neck: no lymphadenopathy, thyromegaly or mass Heart: regular rate and rhythm, no murmur Lungs: clear bilaterally Back: no cva tenderness Abdomen: soft, normal bowel sounds. Slight discomfort in epigastrium, and across mid abdomen. No rebound, guarding.  Nontender in RLQ (just caused some increased pressure across abdomen). Extremities: no edema Neuro: alert and oriented, cranial nerves grossly intact, normal gait Psych: normal mood, affect, hygiene and grooming   ASSESSMENT/PLAN:  Viral gastroenteritis - recent ABX, can't r/o C.diff, so avoid imodium.  BRAT diet, Gas-X prn pain. Probiotics.Return if persistent diarrhea, fever, worsening pain, blood in stool  Stay well hydrated. Avoid dairy for at least 5 days. Continue the [employer]. Take a probiotic.  Re-evaluation is recommended if you have persistent diarrhea, worsening abdominal pain,  fever, blood in the stool, vomiting, or dehydration.  We should do stool studies if your diarrhea isn't resolving. Since you had recent antibiotics, there is the potential for C.diff, so you may want to avoid using imodium.  You can use Gas-X (simethicone) if needed for gas pain (trapped gas)

## 2021-12-12 NOTE — Patient Instructions (Signed)
Stay well hydrated. Avoid dairy for at least 5 days. Continue the [employer]. Take a probiotic.  Re-evaluation is recommended if you have persistent diarrhea, worsening abdominal pain, fever, blood in the stool, vomiting, or dehydration.  We should do stool studies if your diarrhea isn't resolving. Since you had recent antibiotics, there is the potential for C.diff, so you may want to avoid using imodium.  You can use Gas-X (simethicone) if needed for gas pain (trapped gas)

## 2022-03-28 ENCOUNTER — Encounter: Payer: Self-pay | Admitting: Family Medicine

## 2022-05-22 ENCOUNTER — Encounter: Payer: Self-pay | Admitting: Family Medicine

## 2022-05-26 ENCOUNTER — Encounter: Payer: Self-pay | Admitting: Family Medicine

## 2022-05-26 ENCOUNTER — Ambulatory Visit: Payer: BC Managed Care – PPO | Admitting: Family Medicine

## 2022-05-26 VITALS — BP 122/80 | HR 68 | Ht 70.0 in | Wt 211.0 lb

## 2022-05-26 DIAGNOSIS — E782 Mixed hyperlipidemia: Secondary | ICD-10-CM

## 2022-05-26 DIAGNOSIS — R079 Chest pain, unspecified: Secondary | ICD-10-CM

## 2022-05-26 DIAGNOSIS — R202 Paresthesia of skin: Secondary | ICD-10-CM | POA: Diagnosis not present

## 2022-05-26 NOTE — Progress Notes (Signed)
Chief Complaint  Patient presents with   Chest Pain    Chest pain and left arm tingling/numbness. Also had elevated chol on last draw. Patient last ate at 9:00 this am.    Patient had sent a message asking about possible referral to cardiologist, due to some chest complaints and elevated cholesterol.  He reported having several days of "weird sensation" in left part of his chest. He has had this in the past, for several years, and resolve on their own. He describes the pain as sharp, stinging/pinching, in the left part of the chest, not severe.  Sometimes there is a squeezing/flexing sensation in the pectoral muscle (not visible). He got concerned when he also started having some short, shooting pains in the left arm, (along the bottom of the arm, tricep area)  which can also sometimes feel sore and heavy. Having some numbness and tingling in the forearms, affecting the first 3 fingers on the left hand.  Symptoms x 10 days, slightly more intense/frequent. Noticed pain prior to starting a chest workout, not afer.  Went on a walk yesterday--felt a little worse in the chest, and was lightheaded.  Admits he maybe didn't drink enough water.  He denies any fatigue, shortness of breath, nausea, pain in neck or jaw.  Hyperlipidemia--LDL has been up in the past related to eating more fried foods. His last check was shortly after a vacation in [place], eating more cheese curds, fried foods.    Has been careful with diet for the last month. Ate more junk prior to that. No fast food (just once), fried foods.  Mainly grilled chicken, salads, PBJ, yogurt. Beef stew on 3/17 otherwise not much red meat. Some ice cream last night. String cheese once daily. 1 regular soda over the weekend, otherwise none in the last month.  Some diet soda. Mostly water, coffee (creamer)  Component Ref Range & Units 7 mo ago 1 yr ago 2 yr ago  Cholesterol, Total 100 - 199 mg/dL 222 High  218 High  170  Triglycerides 0 - 149  mg/dL 162 High  144 99  HDL >39 mg/dL 45 45 40  VLDL Cholesterol Cal 5 - 40 mg/dL 29 26 18   LDL Chol Calc (NIH) 0 - 99 mg/dL 148 High  147 High  112 High   Chol/HDL Ratio 0.0 - 5.0 ratio 4.9 4.8 CM 4.3 CM    PMH, PSH, FH and SH reviewed  There is FH of hyperlipidemia and HTN. No FH of heart disease  Outpatient Encounter Medications as of 05/26/2022  Medication Sig Note   aspirin 325 MG tablet Take 325 mg by mouth daily. 05/26/2022: Took one yesterday at 2pm   fluticasone (FLONASE) 50 MCG/ACT nasal spray Place 1 spray into both nostrils daily.    loratadine (CLARITIN) 10 MG tablet Take 10 mg by mouth daily.    psyllium (HYDROCIL/METAMUCIL) 95 % PACK Take 1 packet by mouth daily.    [DISCONTINUED] methylcellulose (CITRUCEL) oral powder Take 1 packet by mouth daily.    aspirin-acetaminophen-caffeine (EXCEDRIN MIGRAINE) 250-250-65 MG tablet Take by mouth every 6 (six) hours as needed for headache. (Patient not taking: Reported on 10/10/2021) 05/26/2022: As needed   triamcinolone cream (KENALOG) 0.1 % Apply 1 application topically 2 (two) times daily. (Patient not taking: Reported on 12/01/2019) 05/26/2022: prn   [DISCONTINUED] bismuth subsalicylate (PEPTO BISMOL) 262 MG chewable tablet Chew 524 mg by mouth as needed. 12/12/2021: Took 2 tablets TID yesterday   No facility-administered encounter medications on file  as of 05/26/2022.   No Known Allergies   ROS: no f/c, URI symptoms, n/v/d, bleeding, bruising, rash, weakness. L chest pain, numbness/tingling in fingers, discomfort and heaviness occ in L arm, per HPI. No neck pain. No shortness of breath. No edema.  See HPI.   PHYSICAL EXAM:  BP 122/80   Pulse 68   Ht 5\' 10"  (1.778 m)   Wt 211 lb (95.7 kg)   BMI 30.28 kg/m   Wt Readings from Last 3 Encounters:  05/26/22 211 lb (95.7 kg)  12/12/21 208 lb 12.8 oz (94.7 kg)  10/10/21 207 lb 3.2 oz (94 kg)   Well-appearing pleasant male, slightly anxious, in no distress HEENT:  conjunctiva and sclera are clear, EOMI Neck: no lymphadenopathy or thyromegaly.  No c-spine tenderness Heart: regular rate and rhythm, no murmur Lungs: clear bilaterally Chest wall: nontender.  No focal tenderness, no costochondral tenderness, no muscle spasm. No reproducible pain. Abdomen: soft, nontender, no organomegaly or mass Back: no spinal or CVA tenderness Extremities: no edema  EKG NSR rate 64.  Slightly poor RWP, no other abnormalities noted  Phalen--increased tingling on the left, and also some on the right noted  ASSESSMENT/PLAN:  Chest pain, unspecified type - no acute abnl on EKG, no reproducible chest pain.  Not likely cardiac. Consider coronary Ca score, esp if lipids remain elevated - Plan: EKG 12-Lead  Mixed hyperlipidemia - diet improved over the last month.  recheck today. Reviewed lowfat, low cholesterol diet - Plan: Lipid panel  Paresthesias - L hand, but also had some in the right with phalen testing.  Poss CTS. Discussed, can consider trial of CT brace.  Recommended trial of NSAID (has Aleve at home, can take 1-2 BID with food for 7-10 d). May help with any MSK CP, as well as CTS Symptoms could indicate neck etiology, but nontender, and too many dermatomes affected.  F/u if sx persist/worsen. Discussed coronary CT scan, esp if elevated LDL, ASCVD risk score, or pt's anxiety. Discussed the potential for incidental findings to increase anxiety (and need further eval).  Will check labs today, trial of NSAID, and then determine next step based on response to this

## 2022-05-26 NOTE — Patient Instructions (Signed)
Take aleve, 1-2 pills twice daily with food for 7-10 days and see how your discomfort (left arm and chest) does after this. I suspect at least a component is related to carpal tunnel (vs other nerve-related symptoms in the arm/fingers). I do not think that your pain in the left chest is cardiac. If you have ongoing discomfort in the chest we can do further evaluation.  Consider wearing a carpal tunnel brace to help with the discomfort and tingling in the Left arm/hand.

## 2022-05-27 LAB — LIPID PANEL
Chol/HDL Ratio: 4.3 ratio (ref 0.0–5.0)
Cholesterol, Total: 195 mg/dL (ref 100–199)
HDL: 45 mg/dL (ref >39)
LDL Chol Calc (NIH): 129 mg/dL — ABNORMAL HIGH (ref 0–99)
Triglycerides: 115 mg/dL (ref 0–149)
VLDL Cholesterol Cal: 21 mg/dL (ref 5–40)

## 2022-07-04 ENCOUNTER — Telehealth: Payer: BC Managed Care – PPO | Admitting: Family Medicine

## 2022-07-04 DIAGNOSIS — R21 Rash and other nonspecific skin eruption: Secondary | ICD-10-CM

## 2022-07-04 NOTE — Progress Notes (Signed)
E Visit for Rash  We are sorry that you are not feeling well. Here is how we plan to help!  Based on what you shared with me it looks like you have contact dermatitis.  Contact dermatitis is a skin rash caused by something that touches the skin and causes irritation or inflammation.  Your skin may be red, swollen, dry, cracked, and itch.  The rash should go away in a few days but can last a few weeks.  If you get a rash, it's important to figure out what caused it so the irritant can be avoided in the future.  I recommend you take Benadryl 25 mg - 50 mg every 4 hours to control the symptoms but if they last over 24 hours it is best that you see an office based provider for follow up.  Topical mupiricin  Continue use of current medications and recheck as needed if appearance worsns   HOME CARE:  Take cool showers and avoid direct sunlight. Apply cool compress or wet dressings. Take a bath in an oatmeal bath.  Sprinkle content of one Aveeno packet under running faucet with comfortably warm water.  Bathe for 15-20 minutes, 1-2 times daily.  Pat dry with a towel. Do not rub the rash. Use hydrocortisone cream. Take an antihistamine like Benadryl for widespread rashes that itch.  The adult dose of Benadryl is 25-50 mg by mouth 4 times daily. Caution:  This type of medication may cause sleepiness.  Do not drink alcohol, drive, or operate dangerous machinery while taking antihistamines.  Do not take these medications if you have prostate enlargement.  Read package instructions thoroughly on all medications that you take.  GET HELP RIGHT AWAY IF:  Symptoms don't go away after treatment. Severe itching that persists. If you rash spreads or swells. If you rash begins to smell. If it blisters and opens or develops a yellow-brown crust. You develop a fever. You have a sore throat. You become short of breath.  MAKE SURE YOU:  Understand these instructions. Will watch your condition. Will get  help right away if you are not doing well or get worse.  Thank you for choosing an e-visit.  Your e-visit answers were reviewed by a board certified advanced clinical practitioner to complete your personal care plan. Depending upon the condition, your plan could have included both over the counter or prescription medications.  Please review your pharmacy choice. Make sure the pharmacy is open so you can pick up prescription now. If there is a problem, you may contact your provider through [employer] and have the prescription routed to another pharmacy.  Your safety is important to [place]. If you have drug allergies check your prescription carefully.   For the next 24 hours you can use MyChart to ask questions about today's visit, request a non-urgent call back, or ask for a work or school excuse. You will get an email in the next two days asking about your experience. I hope that your e-visit has been valuable and will speed your recovery.    have provided 5 minutes of non face to face time during this encounter for chart review and documentation.

## 2022-10-11 IMAGING — CR DG FOOT COMPLETE 3+V*R*
3 series · 3 of 3 positions shown · non-contrast
Comparison: None.

CLINICAL DATA: Pain and swelling between first and second MTPs, no
trauma

EXAM:
RIGHT FOOT COMPLETE - 3+ VIEW

[x foot ap right]
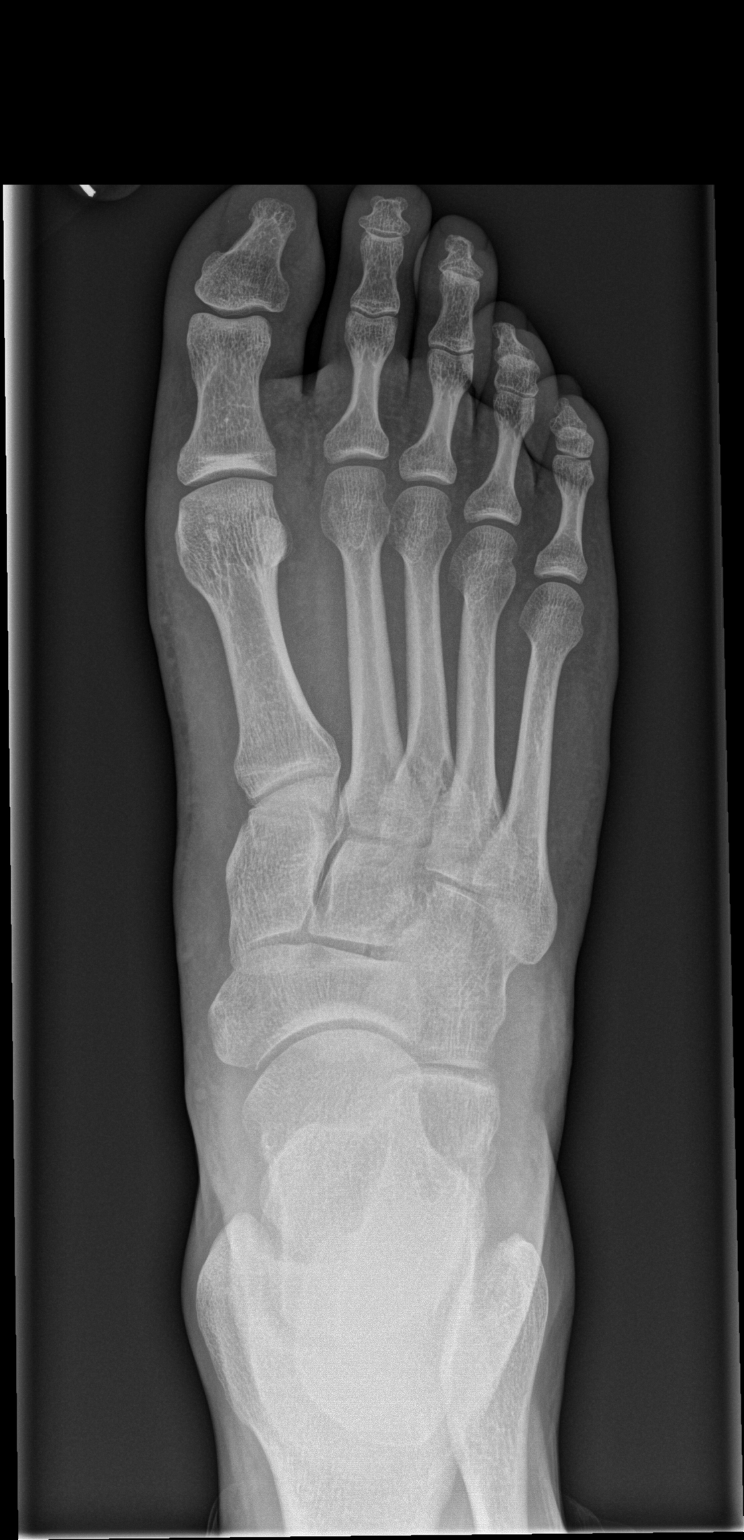

[x foot obl right]
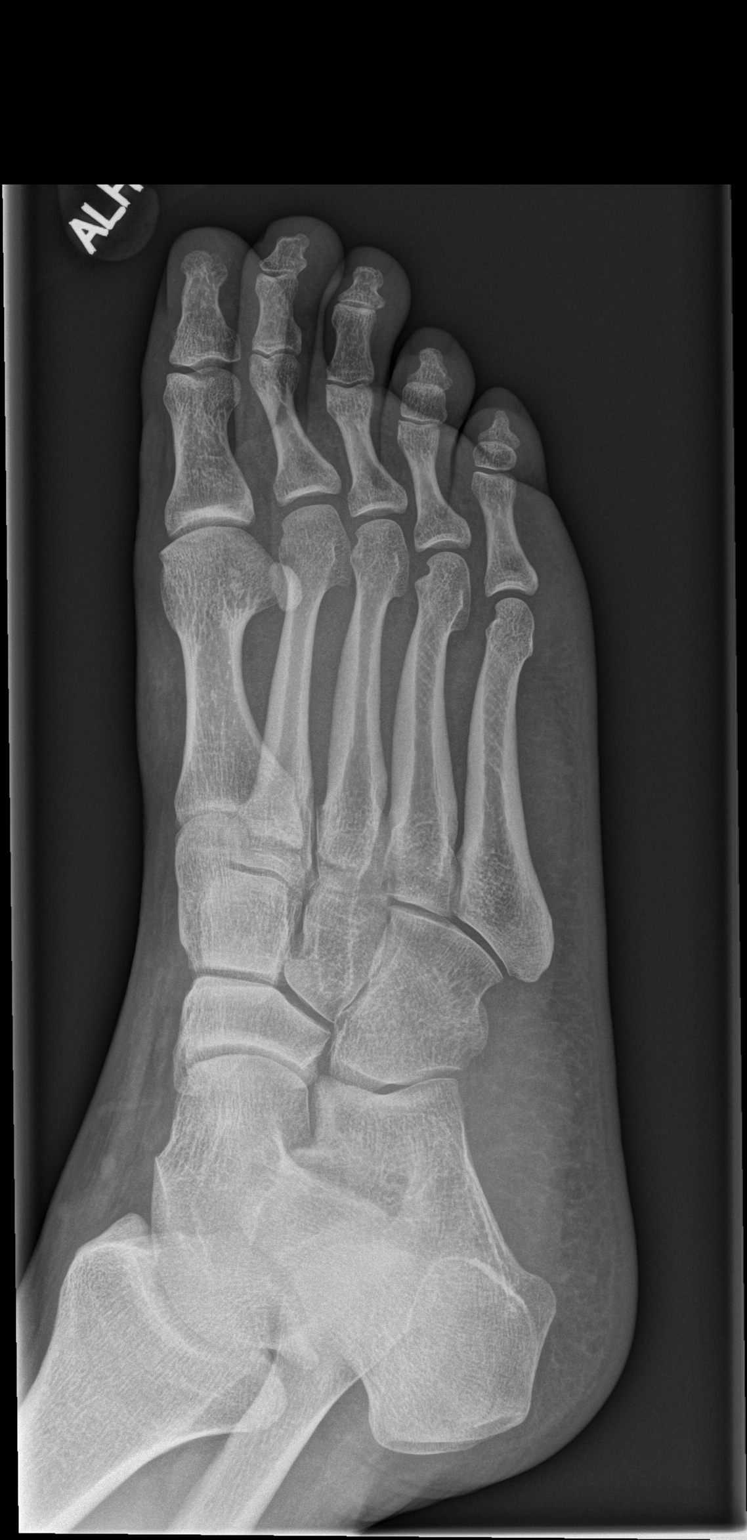

[x foot lat right]
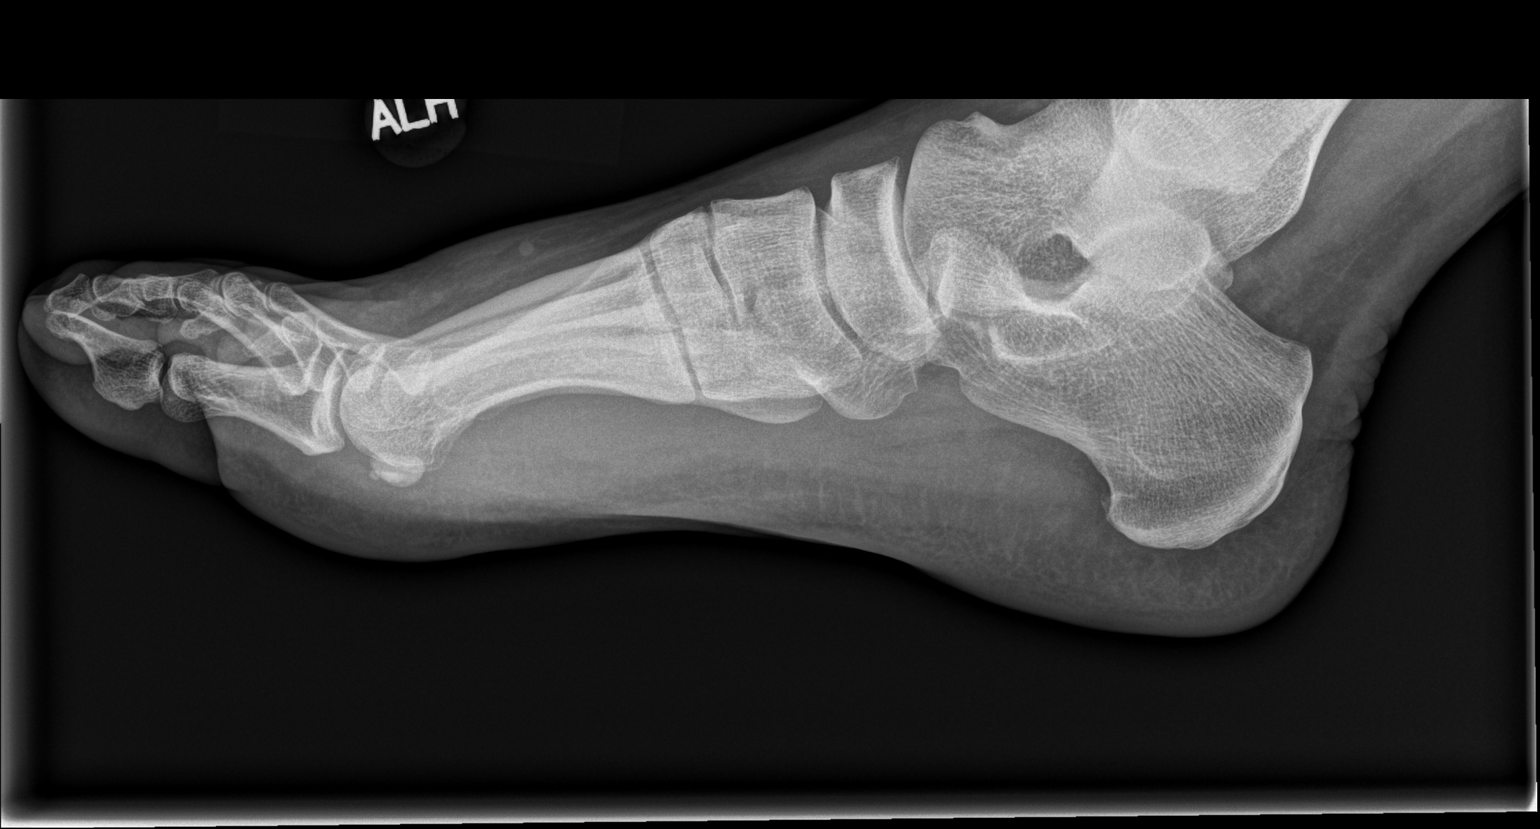

[3 of 3 positions shown; findings below may reference images not displayed]

FINDINGS: There is no acute fracture or dislocation. Bony alignment is normal.
The joint spaces are preserved. There is no erosive change. There is
mild soft tissue swelling over the dorsum of the foot.
IMPRESSION: Mild soft tissue swelling over the dorsum of the foot with no acute
osseous abnormality.

## 2022-10-20 ENCOUNTER — Encounter: Payer: BC Managed Care – PPO | Admitting: Family Medicine

## 2022-10-21 NOTE — Patient Instructions (Incomplete)
  HEALTH MAINTENANCE RECOMMENDATIONS:  It is recommended that you get at least 30 minutes of aerobic exercise at least 5 days/week (for weight loss, you may need as much as 60-90 minutes). This can be any activity that gets your heart rate up. This can be divided in 10-15 minute intervals if needed, but try and build up your endurance at least once a week.  Weight bearing exercise is also recommended twice weekly.  Eat a healthy diet with lots of vegetables, fruits and fiber.  "Colorful" foods have a lot of vitamins (ie green vegetables, tomatoes, red peppers, etc).  Limit sweet tea, regular sodas and alcoholic beverages, all of which has a lot of calories and sugar.  Up to 2 alcoholic drinks daily may be beneficial for men (unless trying to lose weight, watch sugars).  Drink a lot of water.  Sunscreen of at least SPF 30 should be used on all sun-exposed parts of the skin when outside between the hours of 10 am and 4 pm (not just when at beach or pool, but even with exercise, golf, tennis, and yard work!)  Use a sunscreen that says "broad spectrum" so it covers both UVA and UVB rays, and make sure to reapply every 1-2 hours.  Remember to change the batteries in your smoke detectors when changing your clock times in the spring and fall.  Carbon monoxide detectors are recommended for your home.  Use your seat belt every time you are in a car, and please drive safely and not be distracted with cell phones and texting while driving.  Moisturize your skin well. Use over-the-counter hydrocortisone to rashy areas on legs, and change to the stronger prescription steroid cream if not improving, or if very itchy.  Routine eye exam is recommended/
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
## 2022-10-22 ENCOUNTER — Ambulatory Visit: Payer: BC Managed Care – PPO | Admitting: Family Medicine

## 2022-10-22 ENCOUNTER — Encounter: Payer: Self-pay | Admitting: Family Medicine

## 2022-10-22 VITALS — BP 130/70 | HR 76 | Ht 70.0 in | Wt 198.0 lb

## 2022-10-22 DIAGNOSIS — Z0000 Encounter for general adult medical examination without abnormal findings: Secondary | ICD-10-CM

## 2022-10-22 DIAGNOSIS — R21 Rash and other nonspecific skin eruption: Secondary | ICD-10-CM

## 2022-10-22 DIAGNOSIS — E782 Mixed hyperlipidemia: Secondary | ICD-10-CM | POA: Diagnosis not present

## 2022-10-23 LAB — CBC WITH DIFFERENTIAL/PLATELET
Basophils Absolute: 0.1 10*3/uL (ref 0.0–0.2)
Basos: 1 %
EOS (ABSOLUTE): 0.1 10*3/uL (ref 0.0–0.4)
Eos: 2 %
Hematocrit: 47.4 % (ref 37.5–51.0)
Hemoglobin: 16.4 g/dL (ref 13.0–17.7)
Immature Grans (Abs): 0 10*3/uL (ref 0.0–0.1)
Immature Granulocytes: 1 %
Lymphocytes Absolute: 2.6 10*3/uL (ref 0.7–3.1)
Lymphs: 32 %
MCH: 32.5 pg (ref 26.6–33.0)
MCHC: 34.6 g/dL (ref 31.5–35.7)
MCV: 94 fL (ref 79–97)
Monocytes Absolute: 0.6 10*3/uL (ref 0.1–0.9)
Monocytes: 7 %
Neutrophils Absolute: 4.7 10*3/uL (ref 1.4–7.0)
Neutrophils: 57 %
Platelets: 317 10*3/uL (ref 150–450)
RBC: 5.04 x10E6/uL (ref 4.14–5.80)
RDW: 12.1 % (ref 11.6–15.4)
WBC: 8.1 10*3/uL (ref 3.4–10.8)

## 2022-10-23 LAB — CMP14+EGFR
ALT: 9 IU/L (ref 0–44)
AST: 17 IU/L (ref 0–40)
Albumin: 4.8 g/dL (ref 4.1–5.1)
Alkaline Phosphatase: 105 IU/L (ref 44–121)
BUN/Creatinine Ratio: 9 (ref 9–20)
BUN: 11 mg/dL (ref 6–24)
Bilirubin Total: 0.8 mg/dL (ref 0.0–1.2)
CO2: 23 mmol/L (ref 20–29)
Calcium: 9.8 mg/dL (ref 8.7–10.2)
Chloride: 101 mmol/L (ref 96–106)
Creatinine, Ser: 1.17 mg/dL (ref 0.76–1.27)
Globulin, Total: 2.5 g/dL (ref 1.5–4.5)
Glucose: 88 mg/dL (ref 70–99)
Potassium: 4.4 mmol/L (ref 3.5–5.2)
Sodium: 141 mmol/L (ref 134–144)
Total Protein: 7.3 g/dL (ref 6.0–8.5)
eGFR: 80 mL/min/{1.73_m2} (ref >59)

## 2022-10-23 LAB — LIPID PANEL
Chol/HDL Ratio: 4.1 ratio (ref 0.0–5.0)
Cholesterol, Total: 197 mg/dL (ref 100–199)
HDL: 48 mg/dL (ref >39)
LDL Chol Calc (NIH): 128 mg/dL — ABNORMAL HIGH (ref 0–99)
Triglycerides: 116 mg/dL (ref 0–149)
VLDL Cholesterol Cal: 21 mg/dL (ref 5–40)

## 2022-10-24 ENCOUNTER — Encounter: Payer: Self-pay | Admitting: Family Medicine

## 2022-10-24 DIAGNOSIS — E782 Mixed hyperlipidemia: Secondary | ICD-10-CM | POA: Insufficient documentation

## 2022-12-07 ENCOUNTER — Telehealth: Payer: BC Managed Care – PPO | Admitting: Physician Assistant

## 2022-12-07 DIAGNOSIS — B9689 Other specified bacterial agents as the cause of diseases classified elsewhere: Secondary | ICD-10-CM | POA: Diagnosis not present

## 2022-12-07 DIAGNOSIS — J019 Acute sinusitis, unspecified: Secondary | ICD-10-CM | POA: Diagnosis not present

## 2022-12-07 MED ORDER — AMOXICILLIN-POT CLAVULANATE 875-125 MG PO TABS
1.0000 | ORAL_TABLET | Freq: Two times a day (BID) | ORAL | 0 refills | Status: DC
Start: 2022-12-07 — End: 2023-01-07

## 2022-12-07 NOTE — Progress Notes (Signed)

## 2023-01-07 ENCOUNTER — Encounter: Payer: Self-pay | Admitting: Family Medicine

## 2023-01-07 ENCOUNTER — Encounter: Payer: Self-pay | Admitting: *Deleted

## 2023-01-07 ENCOUNTER — Ambulatory Visit: Payer: BC Managed Care – PPO | Admitting: Family Medicine

## 2023-01-07 VITALS — BP 130/82 | HR 76 | Temp 98.3°F | Ht 70.0 in | Wt 207.0 lb

## 2023-01-07 DIAGNOSIS — J309 Allergic rhinitis, unspecified: Secondary | ICD-10-CM

## 2023-01-07 MED ORDER — FLUTICASONE PROPIONATE 50 MCG/ACT NA SUSP
1.0000 | Freq: Every day | NASAL | 3 refills | Status: DC
Start: 2023-01-07 — End: 2024-01-26

## 2023-01-07 MED ORDER — LEVOCETIRIZINE DIHYDROCHLORIDE 5 MG PO TABS: 5.0000 mg | ORAL_TABLET | Freq: Every evening | ORAL | 3 refills | Status: DC

## 2023-01-07 NOTE — Progress Notes (Signed)
Chief Complaint  Patient presents with   Sore Throat    Started  Sat with St and some PND, ears feel "weird". Went to [employer] and some yard work. Kicked up a lot of dust, could it be allergies.     On the evening of 11/2 he started with a burning sensation in the back of his throat, some PND. Feels like the burning sensation in his throat spreads periodically to both ears.  No true ear pain.  Had been outside at [employer] all day, then had been out in the yard, with dry leaves. He suspects his symptoms may be related to allergies.    Blows his nose in the morning, thick and white mucus, no purulence.  Denies sinus pain. Phlegm has been dark brown in the morning, lighter during the day, more of a light yellow. Not coughing.  Using flonase and xyzal daily.  Asking for Rx--his wife got 90d Rx of xyzal for much less $ than buying it OTC.    PMH, PSH, SH reviewed  Outpatient Encounter Medications as of 01/07/2023  Medication Sig Note   fluticasone (FLONASE) 50 MCG/ACT nasal spray Place 1 spray into both nostrils daily.    levocetirizine (XYZAL) 5 MG tablet Take 5 mg by mouth every evening.    ibuprofen (ADVIL) 200 MG tablet Take 200 mg by mouth every 6 (six) hours as needed. (Patient not taking: Reported on 10/22/2022) 01/07/2023: As needed   triamcinolone cream (KENALOG) 0.1 % Apply 1 application topically 2 (two) times daily. (Patient not taking: Reported on 12/01/2019) 01/07/2023: As needed   [DISCONTINUED] amoxicillin-clavulanate (AUGMENTIN) 875-125 MG tablet Take 1 tablet by mouth 2 (two) times daily. (Patient not taking: Reported on 01/07/2023)    No facility-administered encounter medications on file as of 01/07/2023.   No Known Allergies   ROS: no fever, chills, headaches, dizziness, n/v/d.  No sinus pain, cough, shortness of breath. No ear pain. See HPI.    PHYSICAL EXAM:  BP 130/82   Pulse 76   Temp 98.3 F (36.8 C) (Tympanic)   Ht 5\' 10"  (1.778  m)   Wt 207 lb (93.9 kg)   BMI 29.70 kg/m   Well-appearing, pleasant male in no distress He doesn't sound congested. No throat-clearing, sniffling or coughing during visit. HEENT: conjunctiva and sclera are clear, EOMI. TM's and EAC's normal bilaterally Nasal mucosa--mild-mod edema with white-clear mucus L>R. Sinuses are nontender. OP is clear, no erythema. Neck: no lymphadenopathy or mass Heart: regular rate and rhythm Lungs: clear bilaterally Neuro: alert and oriented, cranial nerves grossly intact. Normal gait.    ASSESSMENT/PLAN:  Allergic rhinitis, unspecified seasonality, unspecified trigger - cont flonase, xyzal. Supportive measures reviewed.  S/sx bacterial infection reviewed. To contact us if sx persists/worsens - Plan: fluticasone (FLONASE) 50 MCG/ACT nasal spray, levocetirizine (XYZAL) 5 MG tablet  Continue the flonase and xyzal. You may use sudafed (pseudoephedrine) as needed for nasal congestion and ear plugging/popping/pain. Consider mucinex (guaifenesin) to help keep secretions thin. Consider sinus rinses as needed (once or twice daily).  Consider wearing a mask while doing yardwork

## 2023-01-07 NOTE — Patient Instructions (Signed)
  Continue the flonase and xyzal. You may use sudafed (pseudoephedrine) as needed for nasal congestion and ear plugging/popping/pain. Consider mucinex (guaifenesin) to help keep secretions thin. Consider sinus rinses as needed (once or twice daily).  Consider wearing a mask while doing yardwork

## 2023-10-21 NOTE — Patient Instructions (Incomplete)

## 2023-10-21 NOTE — Progress Notes (Unsigned)
 No chief complaint on file.  Jason Casey is a 43 y.o. male who presents for a complete physical.    Allergies:  He uses flonase  and xyzal  daily.   H/o eczema, uses TAC 0.1% cream prn for flares. This mainly affects him in the winter.   He has had bowel issues (constipation, loose stools), presumed IBS.  He saw Dr. Leigh related to rectal bleeding and had colonoscopy in 03/2019. He was noted to have anal fissure and was recommended to take a daily fiber supplement (the NTG ointment caused HA's, fissure resolved).  Bowels are fairly normal now.  He has some intermittent constipation (when he doesn't drink enough water).   Mixed hyperlipidemia:  Had higher LDL's (140's) in 2022 and 7976, diet hadn't been as good. LDL improved since then.  Diet--red meat 1x/week or less, mostly chicken, salmon. Some cheese (string cheese daily, part-skim), greek yogurt (regular, not lowfat) 2 eggs/week or less.  Component Ref Range & Units (hover) 12 mo ago 1 yr ago 2 yr ago 3 yr ago 4 yr ago  Cholesterol, Total 197 195 222 High  218 High  170  Triglycerides 116 115 162 High  144 99  HDL 48 45 45 45 40  VLDL Cholesterol Cal 21 21 29 26 18   LDL Chol Calc (NIH) 128 High  129 High  148 High  147 High  112 High   Chol/HDL Ratio 4.1 4.3 CM 4.9 CM 4.8 CM 4.3 CM     Immunization History  Administered Date(s) Administered   DTaP 11/12/1980, 02/14/1981, 04/30/1981, 06/20/1982   IPV 11/12/1980, 02/14/1981, 04/30/1981, 06/20/1982   Influenza Inj Mdck Quad Pf 11/30/2018   Influenza,inj,Quad PF,6+ Mos 12/03/2017, 12/02/2018, 12/01/2019, 12/05/2021   Influenza-Unspecified 12/03/2017, 11/09/2020, 12/19/2022   MMR 01/04/1982, 09/21/2017   PFIZER(Purple Top)SARS-COV-2 Vaccination 05/06/2019, 05/27/2019, 12/01/2019   PPD Test 09/21/2017   Pfizer Covid-19 Vaccine Bivalent Booster 5y-11y 11/09/2020, 11/26/2021   Pfizer(Comirnaty)Fall Seasonal Vaccine 12 years and older 12/26/2022   Tdap 09/21/2017   Last  colonoscopy: 03/2019 Dr. Leigh. Noted to have anal fissure, internal hemorrhoids, hypertrophied anal papilla(e) Ophtho: never Dentist:  Twice yearly Exercise:    Exercising 2x/week--a little off routine since school started. Some running, pushups-pull-ups.  Some golf (has cart, but walks a lot). Hasn't done yoga in a while.   Weight training sporadically currently (body resistance, some dumbbells).     10/22/2022    2:39 PM 10/10/2021    9:57 AM 10/04/2020    9:49 AM 09/29/2019    1:50 PM 09/27/2018   10:08 AM  Depression screen PHQ 2/9  Decreased Interest 0 0 0 0 0  Down, Depressed, Hopeless 0 0 0 0 0  PHQ - 2 Score 0 0 0 0 0     PMH, PSH, SH and FH were reviewed and updated    ROS:  The patient denies anorexia, fever, vision loss, decreased hearing, ear pain, hoarseness, palpitations, chest pain, dizziness, syncope, dyspnea on exertion, cough, swelling, nausea, vomiting, abdominal pain, melena, hematuria, incontinence, erectile dysfunction, nocturia, weakened urine stream, dysuria, genital lesions, weakness, tremor, suspicious skin lesions, depression, abnormal bleeding/bruising, or enlarged lymph nodes.  Allergies are pretty well controlled. Mild eczema on legs.   Some trouble falling and staying asleep when stressed, just sporadic. Intermittent hip pain, r/b stretches (improves when doing yoga regularly). No exertional chest pain.  Rare heartburn   PHYSICAL EXAM:  There were no vitals taken for this visit.  Wt Readings from Last 3 Encounters:  01/07/23 207  lb (93.9 kg)  10/22/22 198 lb (89.8 kg)  05/26/22 211 lb (95.7 kg)   General Appearance:    Alert, cooperative, no distress, appears stated age  Head:    Normocephalic, without obvious abnormality, atraumatic  Eyes:    PERRL, conjunctiva/corneas clear, EOM's intact, fundi    benign  Ears:    Normal TM's and external ear canals  Nose:   No drainage or sinus tenderness  Throat:   Normal mucosa, no lesions   Neck:   Supple, no lymphadenopathy;  thyroid:  no enlargement/ tenderness/nodules; no carotid bruit or JVD  Back:    Spine nontender, no curvature, ROM normal, no CVA  tenderness.   Lungs:     Clear to auscultation bilaterally without wheezes, rales or ronchi; respirations unlabored  Chest Wall:    No tenderness or deformity.    Heart:    Regular rate and rhythm, S1 and S2 normal, no murmur, rub or gallop  Breast Exam:    No chest wall tenderness, masses or gynecomastia.   Abdomen:     Soft, non-tender, nondistended, normoactive bowel sounds, no masses, no hepatosplenomegaly  Genitalia:    Normal male external genitalia without lesions.  Testicles without masses. No inguinal hernias present.  Rectal:   Normal sphincter tone, no masses. Prostate is smooth, not enlarged, no nodules, nontender.  Heme negative stool. Somewhat mobile, nontender internal hemorrhoid noted, unchanged.   Extremities:   No clubbing, cyanosis or edema.  Pulses:   2+ and symmetric all extremities  Skin:   Skin color, texture, turgor normal. R shin--slightlyyellow, hyperpigmented centrally (unchanged from last year).    Lateral to this area there are 2 spots that are pink, slightly dry (5 x 3 and 3.5 x 4 cm).  He also has macular areas (round and oval) on L>R buttocks, and 2 small oval areas on the lateral L lower back, and some on upper thighs bilaterally. There is some general splotchiness (erythema) of R lower abdomen.   There is a pigmented lesion that is just under 5mm in size at the L upper chest.  Uniform, dark color, normal borders.  Lymph nodes:   Cervical, supraclavicular, and inguinal  nodes normal  Neurologic:   Normal strength, sensation and gait; reflexes 2+ and symmetric throughout                            Psych:   Normal mood, affect, hygiene and grooming  ***UPDATE SKIN, RECTAL  ASSESSMENT/PLAN:  Did he ever get eye exam?  Recommended at least 30 minutes of aerobic activity at least 5 days/week,  weight-bearing exercise at least 2x/week; proper sunscreen use reviewed; healthy diet and alcohol recommendations (less than or equal to 2 drinks/day) reviewed; regular seatbelt use; changing batteries in smoke detectors, having carbon monoxide detectors. Immunization recommendations discussed, yearly flu shots recommended. Discussed possibly getting updated COVID booster when available in the Fall. Routine eye exam recommended  F/u 1 year, sooner prn

## 2023-10-22 ENCOUNTER — Ambulatory Visit (INDEPENDENT_AMBULATORY_CARE_PROVIDER_SITE_OTHER): Payer: BC Managed Care – PPO | Admitting: Family Medicine

## 2023-10-22 VITALS — BP 134/84 | HR 84 | Ht 70.0 in | Wt 180.6 lb

## 2023-10-22 DIAGNOSIS — J309 Allergic rhinitis, unspecified: Secondary | ICD-10-CM | POA: Diagnosis not present

## 2023-10-22 DIAGNOSIS — L309 Dermatitis, unspecified: Secondary | ICD-10-CM | POA: Diagnosis not present

## 2023-10-22 DIAGNOSIS — E782 Mixed hyperlipidemia: Secondary | ICD-10-CM | POA: Diagnosis not present

## 2023-10-22 DIAGNOSIS — Z0000 Encounter for general adult medical examination without abnormal findings: Secondary | ICD-10-CM

## 2023-10-22 DIAGNOSIS — Z113 Encounter for screening for infections with a predominantly sexual mode of transmission: Secondary | ICD-10-CM

## 2023-10-22 LAB — LIPID PANEL

## 2023-10-23 ENCOUNTER — Ambulatory Visit: Payer: Self-pay | Admitting: Family Medicine

## 2023-10-23 LAB — LIPID PANEL
Cholesterol, Total: 177 mg/dL (ref 100–199)
HDL: 52 mg/dL (ref >39)
LDL CALC COMMENT:: 3.4 ratio (ref 0.0–5.0)
LDL Chol Calc (NIH): 100 mg/dL — AB (ref 0–99)
Triglycerides: 142 mg/dL (ref 0–149)
VLDL Cholesterol Cal: 25 mg/dL (ref 5–40)

## 2023-10-23 LAB — COMPREHENSIVE METABOLIC PANEL WITH GFR
ALT: 10 IU/L (ref 0–44)
AST: 15 IU/L (ref 0–40)
Albumin: 4.8 g/dL (ref 4.1–5.1)
Alkaline Phosphatase: 108 IU/L (ref 44–121)
BUN/Creatinine Ratio: 12 (ref 9–20)
BUN: 16 mg/dL (ref 6–24)
Bilirubin Total: 0.5 mg/dL (ref 0.0–1.2)
CO2: 24 mmol/L (ref 20–29)
Calcium: 9.8 mg/dL (ref 8.7–10.2)
Chloride: 100 mmol/L (ref 96–106)
Creatinine, Ser: 1.38 mg/dL — AB (ref 0.76–1.27)
Globulin, Total: 2.5 g/dL (ref 1.5–4.5)
Glucose: 94 mg/dL (ref 70–99)
Potassium: 4.5 mmol/L (ref 3.5–5.2)
Sodium: 139 mmol/L (ref 134–144)
Total Protein: 7.3 g/dL (ref 6.0–8.5)
eGFR: 65 mL/min/1.73 (ref >59)

## 2023-10-23 LAB — CBC WITH DIFFERENTIAL/PLATELET
Basophils Absolute: 0.1 x10E3/uL (ref 0.0–0.2)
Basos: 1 %
EOS (ABSOLUTE): 0.2 x10E3/uL (ref 0.0–0.4)
Eos: 2 %
Hematocrit: 51 % (ref 37.5–51.0)
Hemoglobin: 17 g/dL (ref 13.0–17.7)
Immature Grans (Abs): 0 x10E3/uL (ref 0.0–0.1)
Immature Granulocytes: 0 %
Lymphocytes Absolute: 2.7 x10E3/uL (ref 0.7–3.1)
Lymphs: 29 %
MCH: 32.8 pg (ref 26.6–33.0)
MCHC: 33.3 g/dL (ref 31.5–35.7)
MCV: 99 fL — ABNORMAL HIGH (ref 79–97)
Monocytes Absolute: 0.7 x10E3/uL (ref 0.1–0.9)
Monocytes: 8 %
Neutrophils Absolute: 5.5 x10E3/uL (ref 1.4–7.0)
Neutrophils: 60 %
Platelets: 349 x10E3/uL (ref 150–450)
RBC: 5.18 x10E6/uL (ref 4.14–5.80)
RDW: 11.7 % (ref 11.6–15.4)
WBC: 9.1 x10E3/uL (ref 3.4–10.8)

## 2023-10-23 LAB — HIV ANTIBODY (ROUTINE TESTING W REFLEX): HIV Screen 4th Generation wRfx: NONREACTIVE

## 2023-10-23 LAB — RPR: RPR Ser Ql: NONREACTIVE

## 2023-10-24 LAB — GC/CHLAMYDIA PROBE AMP
Chlamydia trachomatis, NAA: NEGATIVE
Neisseria Gonorrhoeae by PCR: NEGATIVE

## 2023-11-18 ENCOUNTER — Encounter: Payer: Self-pay | Admitting: Family Medicine

## 2023-11-24 NOTE — Progress Notes (Unsigned)
 Start time: End time:  Virtual Visit via Video Note  I connected with Jason Casey on 11/24/23 by a video enabled telemedicine application and verified that I am speaking with the correct person using two identifiers.  Location: Patient: *** Provider: office   I discussed the limitations of evaluation and management by telemedicine and the availability of in person appointments. The patient expressed understanding and agreed to proceed.  History of Present Illness:  No chief complaint on file.  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 He also just separated from his wife, reporting he was unfaithful, was in a relationship with another woman.  He sent the following message on 9/17: I am writing to request a note from you to go out on FMLA. 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 I am also going through a very public, very messy divorce which is expensive in its own way. I am dealing with a great deal of emotional turmoil, anxiety, and stress. 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 As I told my therapist yesterday, the weight of everything is making it hard to function, and if I could use this benefit to my advantage, that would be very helpful. Would you be willing to write me out of work for a period of time? I was thinking a month to start would be good. I am happy to discuss this over the phone if need be, but I am desperately in need of some situational relief, and  would appreciate help in securing FMLA leave so I can focus my attention on stabilizing myself and my situations. Thank you so much.  I replied: I typically only write out for FMLA for someone with psych issues (depression/anxiety) when medications are being utilized, for a few weeks, to allow the medications time to work, along with intensive therapy. If medications aren't being utilized to help with stress and coping, then the FMLA needs to come from your therapist. If you or your therapist feel that medications would be beneficial, or would like to discuss them more, we can set up a virtual visit to discuss in more detail.  He reported that his therapist isn't able to diagnose or prescribe anything. I did mention that I do feel like I'm on the verge of a nervous breakdown. I don't want to go on medication just to go on medication, but I am really having a hard time coping with all of it. My therapist told me to reach back out to you. Can we set up a virtual visit to discuss a path forward here? Keeping busy won't do it for me. I have been keeping busy looking for work. It is not helping, and removing my ability to look for work around the clock while dealing with everything else going on is making me incredibly anxious. I don't know what else to do. Can we meet to discuss this further?   ***  PMH, PSH, SH reviewed   ROS:    Observations/Objective:  There were no vitals taken for this visit.  Wt Readings from Last 3 Encounters:  10/22/23 180 lb 9.6 oz (81.9 kg)  01/07/23 207 lb (93.9 kg)  10/22/22 198 lb (89.8 kg)          10/22/2023    2:36 PM 10/22/2022    2:39 PM 10/10/2021    9:57 AM 10/04/2020    9:49 AM 09/29/2019    1:50 PM  Depression screen PHQ 2/9  Decreased Interest 0 0 0 0 0  Down, Depressed, Hopeless 0 0 0 0 0  PHQ - 2 Score 0 0 0 0 0      10/10/2021    9:58 AM  GAD 7 : Generalized Anxiety Score  Nervous, Anxious, on Edge 2  Control/stop worrying 0   Worry too much - different things 3  Trouble relaxing 1  Restless 0  Easily annoyed or irritable 0  Afraid - awful might happen 3  Total GAD 7 Score 9  Anxiety Difficulty Somewhat difficult      Assessment and Plan:  Phq9 and gad7  Who is his therapist?  FMLA CAN be written by clinical psychologists, LCSWs (pt stated his therapist couldn't do it--?)  For mental health condition to qualify for FMLA, needs to meet serious health condition--therapist can provide the following medical certification--  Date it began and duration, how/why it makes you unable to perform the essential functions of your job. Therapist doesn't need t provide specific diagnosis, just medical facts to justify the need for leave    Follow Up Instructions:    I discussed the assessment and treatment plan with the patient. The patient was provided an opportunity to ask questions and all were answered. The patient agreed with the plan and demonstrated an understanding of the instructions.   The patient was advised to call back or seek an in-person evaluation if the symptoms worsen or if the condition fails to improve as anticipated.  I spent *** minutes dedicated to the care of this patient, including pre-visit review of records, face to face time, post-visit ordering of testing and documentation.    Annabelle DELENA Fetters, MD

## 2023-11-25 ENCOUNTER — Encounter: Payer: Self-pay | Admitting: Family Medicine

## 2023-11-25 ENCOUNTER — Telehealth: Admitting: Family Medicine

## 2023-11-25 VITALS — HR 66 | Ht 70.0 in | Wt 181.8 lb

## 2023-11-25 DIAGNOSIS — F4323 Adjustment disorder with mixed anxiety and depressed mood: Secondary | ICD-10-CM

## 2023-11-25 MED ORDER — SERTRALINE HCL 50 MG PO TABS: ORAL_TABLET | ORAL | 0 refills | Status: DC

## 2023-11-25 MED ORDER — ALPRAZOLAM 0.25 MG PO TABS: 0.2500 mg | ORAL_TABLET | Freq: Three times a day (TID) | ORAL | 0 refills | Status: AC | PRN

## 2023-11-25 NOTE — Patient Instructions (Signed)
 Start sertraline  50mg  tablet by taking HALF TABLET at bedtime. If you cannot tolerate that, you can start at 1/4 tablet (if you can further cut it). Stay on the 1/2 tablet for a week--you can stay on this for longer if the side effects (headache, nausea, increased anxiety) don't resolve/significantly diminish by the end of the week. Then increase to the full pill and stay there.  We are also prescribing alprazolam  (xanax ) to use as a rescue for severe anxiety.  Remember not to mix this with alcohol and use aution with driving as it may make you sleepy. You may use up to 2 tablets at a time, if needed (especially in the evening, when a little sedation could be tolerated better, if not driving) The goal is to use the alprazolam  sparingly, to help with any increased anxiety that occurs while starting the sertraline .  Ultimately the sertraline  should help the moods, and you shouldn't need it often.

## 2023-11-26 ENCOUNTER — Encounter: Payer: Self-pay | Admitting: Family Medicine
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
## 2023-12-09 ENCOUNTER — Telehealth (INDEPENDENT_AMBULATORY_CARE_PROVIDER_SITE_OTHER): Admitting: Family Medicine

## 2023-12-09 ENCOUNTER — Encounter: Payer: Self-pay | Admitting: Family Medicine

## 2023-12-09 DIAGNOSIS — F4323 Adjustment disorder with mixed anxiety and depressed mood: Secondary | ICD-10-CM

## 2023-12-09 MED ORDER — SERTRALINE HCL 50 MG PO TABS: 50.0000 mg | ORAL_TABLET | Freq: Every day | ORAL | 0 refills | Status: DC

## 2023-12-31 ENCOUNTER — Encounter: Payer: Self-pay | Admitting: Family Medicine

## 2024-01-06 ENCOUNTER — Encounter: Payer: Self-pay | Admitting: Family Medicine

## 2024-01-06 DIAGNOSIS — R7612 Nonspecific reaction to cell mediated immunity measurement of gamma interferon antigen response without active tuberculosis: Secondary | ICD-10-CM

## 2024-01-08 ENCOUNTER — Telehealth: Payer: Self-pay

## 2024-01-08 ENCOUNTER — Ambulatory Visit (INDEPENDENT_AMBULATORY_CARE_PROVIDER_SITE_OTHER)

## 2024-01-08 DIAGNOSIS — R7612 Nonspecific reaction to cell mediated immunity measurement of gamma interferon antigen response without active tuberculosis: Secondary | ICD-10-CM

## 2024-01-08 NOTE — Telephone Encounter (Signed)
 Copied from CRM #[id number]. Topic: Clinical - Request for Lab/Test Order >> Jan 08, 2024  3:31 PM Amy B wrote: Reason for CRM: Patient requests order for chest x-ray which is needed for his new job.  Please contact patient when completed.  [phone]

## 2024-01-08 NOTE — Telephone Encounter (Signed)
 Copied from CRM [id number]. Topic: General - Other >> Jan 08, 2024  2:21 PM Thersia C wrote: Reason for CRM: Patient called in regarding his positive tb test, wanted to know if she could get a order for a chest xray would like someone to give him a callback when that order is in

## 2024-01-11 ENCOUNTER — Ambulatory Visit: Payer: Self-pay | Admitting: Family Medicine

## 2024-01-11 NOTE — Telephone Encounter (Signed)
 I called right at 8am and asked for this to be read STAT.

## 2024-01-26 ENCOUNTER — Other Ambulatory Visit: Payer: Self-pay | Admitting: Family Medicine

## 2024-01-26 DIAGNOSIS — J309 Allergic rhinitis, unspecified: Secondary | ICD-10-CM

## 2024-02-05 ENCOUNTER — Other Ambulatory Visit: Payer: Self-pay | Admitting: Family Medicine

## 2024-02-05 DIAGNOSIS — J309 Allergic rhinitis, unspecified: Secondary | ICD-10-CM

## 2024-03-09 ENCOUNTER — Telehealth: Admitting: Physician Assistant

## 2024-03-09 DIAGNOSIS — B9689 Other specified bacterial agents as the cause of diseases classified elsewhere: Secondary | ICD-10-CM | POA: Diagnosis not present

## 2024-03-09 DIAGNOSIS — J019 Acute sinusitis, unspecified: Secondary | ICD-10-CM

## 2024-03-09 MED ORDER — AMOXICILLIN-POT CLAVULANATE 875-125 MG PO TABS: 1.0000 | ORAL_TABLET | Freq: Two times a day (BID) | ORAL | 0 refills | Status: AC

## 2024-03-09 NOTE — Progress Notes (Signed)

## 2024-03-23 ENCOUNTER — Encounter: Payer: Self-pay | Admitting: Family Medicine

## 2024-03-23 ENCOUNTER — Other Ambulatory Visit: Payer: Self-pay | Admitting: *Deleted

## 2024-03-23 DIAGNOSIS — F4323 Adjustment disorder with mixed anxiety and depressed mood: Secondary | ICD-10-CM

## 2024-03-23 MED ORDER — SERTRALINE HCL 50 MG PO TABS: 50.0000 mg | ORAL_TABLET | Freq: Every day | ORAL | 1 refills | Status: AC

## 2024-03-24 ENCOUNTER — Other Ambulatory Visit: Payer: Self-pay | Admitting: Family Medicine

## 2024-03-24 DIAGNOSIS — F4323 Adjustment disorder with mixed anxiety and depressed mood: Secondary | ICD-10-CM

## 2024-04-05 ENCOUNTER — Other Ambulatory Visit: Payer: Self-pay | Admitting: Family Medicine

## 2024-04-05 DIAGNOSIS — J309 Allergic rhinitis, unspecified: Secondary | ICD-10-CM

## 2024-10-24 ENCOUNTER — Encounter: Payer: Self-pay | Admitting: Family Medicine
